# Patient Record
Sex: Male | Born: 1975 | Race: Black or African American | Hispanic: No | Marital: Married | Smoking: Never smoker
Health system: Southern US, Community
[De-identification: ages and names within clinical notes are randomized; demographics above are authoritative.]

## PROBLEM LIST (undated history)

## (undated) DIAGNOSIS — I10 Essential (primary) hypertension: Secondary | ICD-10-CM

## (undated) DIAGNOSIS — E785 Hyperlipidemia, unspecified: Secondary | ICD-10-CM

## (undated) DIAGNOSIS — E119 Type 2 diabetes mellitus without complications: Secondary | ICD-10-CM

## (undated) HISTORY — PX: OTHER SURGICAL HISTORY: SHX169

---

## 2019-06-15 ENCOUNTER — Emergency Department: Payer: Self-pay

## 2019-06-15 ENCOUNTER — Emergency Department
Admission: EM | Admit: 2019-06-15 | Discharge: 2019-06-15 | Disposition: A | Discharge status: Home / Self Care | Payer: Self-pay | Attending: Emergency Medicine | Admitting: Emergency Medicine

## 2019-06-15 ENCOUNTER — Other Ambulatory Visit: Payer: Self-pay

## 2019-06-15 DIAGNOSIS — R1011 Right upper quadrant pain: Secondary | ICD-10-CM | POA: Insufficient documentation

## 2019-06-15 DIAGNOSIS — N281 Cyst of kidney, acquired: Secondary | ICD-10-CM | POA: Insufficient documentation

## 2019-06-15 LAB — CBC
HCT: 32.5 % — ABNORMAL LOW (ref 39.0–52.0)
Hemoglobin: 10.8 g/dL — ABNORMAL LOW (ref 13.0–17.0)
MCH: 26.5 pg (ref 26.0–34.0)
MCHC: 33.2 g/dL (ref 30.0–36.0)
MCV: 79.9 fL — ABNORMAL LOW (ref 80.0–100.0)
Platelets: 190 10*3/uL (ref 150–400)
RBC: 4.07 MIL/uL — ABNORMAL LOW (ref 4.22–5.81)
RDW: 13.6 % (ref 11.5–15.5)
WBC: 5.5 10*3/uL (ref 4.0–10.5)
nRBC: 0 % (ref 0.0–0.2)

## 2019-06-15 LAB — COMPREHENSIVE METABOLIC PANEL
ALT: 10 U/L (ref 0–44)
AST: 12 U/L — ABNORMAL LOW (ref 15–41)
Albumin: 3.2 g/dL — ABNORMAL LOW (ref 3.5–5.0)
Alkaline Phosphatase: 39 U/L (ref 38–126)
Anion gap: 4 — ABNORMAL LOW (ref 5–15)
BUN: 22 mg/dL — ABNORMAL HIGH (ref 6–20)
CO2: 23 mmol/L (ref 22–32)
Calcium: 8.7 mg/dL — ABNORMAL LOW (ref 8.9–10.3)
Chloride: 112 mmol/L — ABNORMAL HIGH (ref 98–111)
Creatinine, Ser: 1.33 mg/dL — ABNORMAL HIGH (ref 0.61–1.24)
GFR calc Af Amer: >60 mL/min (ref >60)
GFR calc non Af Amer: >60 mL/min (ref >60)
Glucose, Bld: 195 mg/dL — ABNORMAL HIGH (ref 70–99)
Potassium: 4.9 mmol/L (ref 3.5–5.1)
Sodium: 139 mmol/L (ref 135–145)
Total Bilirubin: 0.9 mg/dL (ref 0.3–1.2)
Total Protein: 6.7 g/dL (ref 6.5–8.1)

## 2019-06-15 LAB — LIPASE, BLOOD: Lipase: 19 U/L (ref 11–51)

## 2019-06-15 LAB — URINALYSIS, COMPLETE (UACMP) WITH MICROSCOPIC
Bacteria, UA: NONE SEEN
Bilirubin Urine: NEGATIVE
Glucose, UA: NEGATIVE mg/dL
Hgb urine dipstick: NEGATIVE
Ketones, ur: NEGATIVE mg/dL
Leukocytes,Ua: NEGATIVE
Nitrite: NEGATIVE
Protein, ur: 100 mg/dL — AB
Specific Gravity, Urine: 1.01 (ref 1.005–1.030)
Squamous Epithelial / HPF: NONE SEEN (ref 0–5)
pH: 5 (ref 5.0–8.0)

## 2019-06-15 MED ORDER — CYCLOBENZAPRINE HCL 5 MG PO TABS
7.5000 mg | ORAL_TABLET | ORAL | Status: DC
  Filled 2019-06-15: qty 1.5

## 2019-06-15 MED ORDER — IOHEXOL 300 MG/ML  SOLN
125.0000 mL | Freq: Once | INTRAMUSCULAR | Status: AC | PRN
  Administered 2019-06-15: 150 mL via INTRAVENOUS
  Filled 2019-06-15: qty 125

## 2019-06-15 MED ORDER — CYCLOBENZAPRINE HCL 7.5 MG PO TABS: 7.5000 mg | ORAL_TABLET | Freq: Every evening | ORAL | 0 refills | Status: AC | PRN

## 2019-06-15 MED ORDER — IOHEXOL 240 MG/ML SOLN
50.0000 mL | Freq: Once | INTRAMUSCULAR | Status: AC
  Administered 2019-06-15: 19:00:00 50 mL via ORAL
  Filled 2019-06-15: qty 50

## 2019-06-15 MED ORDER — SODIUM CHLORIDE 0.9% FLUSH: 3.0000 mL | Freq: Once | INTRAVENOUS | Status: DC

## 2019-06-15 NOTE — ED Notes (Signed)
Pt does not want to stay to receive pain medicine

## 2019-06-15 NOTE — ED Notes (Signed)
This Rn attempted IV access x2 

## 2019-06-15 NOTE — ED Notes (Signed)
Pharmacy contacted regarding flexeril, tech states that they are working on it.

## 2019-06-15 NOTE — ED Triage Notes (Signed)
Pt comes via POV from home with c/o right lower abdominal pain that radiates to back. Pt states this has been going on for about a week.  Pt states he has had some issues urinating frequency. Pt denies any pain with urination.

## 2019-06-15 NOTE — ED Provider Notes (Signed)
[facility] Emergency Department Provider Note   ____________________________________________   First MD Initiated Contact with Patient 06/15/19 1843     (approximate)  I have reviewed the triage vital signs and the nursing notes.   HISTORY  Chief Complaint Abdominal Pain    HPI Albert Rice is a 43 y.o. male here for evaluation of right upper abdominal pain  Pain in his right upper abdomen that wraps around slightly towards his back for about 2 weeks.  Seems to be steadily getting worse however.  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  He saw his primary care doctor a few days ago, and he also reports he mentioned the symptoms to them at that time, they told him he had some protein in his urine.  No fevers or chills.  No known exposure to coronavirus.  Reports he has upcoming evaluation for protein in his urine and his primary doctors evaluate as well as some anemia as well.  No changes in bowel habits.  No black or bloody stool.  No vomiting.  Ports just the pain is persistent feels like a cramp or roll or bubble feeling primarily in the right upper abdomen.  Denies chest pain or shortness of breath   History reviewed. No pertinent past medical history.  There are no active problems to display for this patient.   History reviewed. No pertinent surgical history.  Prior to Admission medications   Medication Sig Start Date End Date Taking? Authorizing Provider  cyclobenzaprine (FEXMID) 7.5 MG tablet Take 1 tablet (7.5 mg total) by mouth at bedtime as needed for muscle spasms. 06/15/19   Sharyn CreamerQuale, Haytham Maher, MD    Allergies Patient has no allergy information on record.  No family history on file.  Social History Social History   Tobacco Use   Smoking status: Not on file  Substance Use Topics   Alcohol use: Not on file   Drug use: Not on file  Denies alcohol abuse, non-smoker  Review of  Systems Constitutional: No fever/chills Eyes: No visual changes. ENT: No sore throat. Cardiovascular: Denies chest pain. Respiratory: Denies shortness of breath. Gastrointestinal: See HPI Genitourinary: Negative for dysuria but reports little bit of urinary frequency and that his doctors been telling him he has protein in his urine which they are further evaluating. Musculoskeletal: Negative for back pain. Skin: Negative for rash. Neurological: Negative for headaches, areas of focal weakness or numbness.    ____________________________________________   PHYSICAL EXAM:  VITAL SIGNS: ED Triage Vitals  Enc Vitals Group     BP 06/15/19 1514 (!) 153/83     Pulse Rate 06/15/19 1514 82     Resp 06/15/19 1514 18     Temp 06/15/19 1514 98.3 F (36.8 C)     Temp Source 06/15/19 1514 Oral     SpO2 06/15/19 1514 99 %     Weight 06/15/19 1516 (!) 303 lb (137.4 kg)     Height 06/15/19 1516 5\' 11"  (1.803 m)     Head Circumference --      Peak Flow --      Pain Score 06/15/19 1516 6     Pain Loc --      Pain Edu? --      Excl. in GC? --     Constitutional: Alert and oriented. Well appearing and in no acute distress. Eyes: Conjunctivae are normal. Head: Atraumatic. Nose: No congestion/rhinnorhea. Mouth/Throat: Mucous membranes are moist.  Neck: No stridor.  Cardiovascular: Normal rate, regular rhythm. Grossly normal heart sounds.  Good peripheral circulation. Respiratory: Normal respiratory effort.  No retractions. Lungs CTAB. Gastrointestinal: Soft and nontender except in the right flank and mild discomfort in the right upper quadrant without an obvious Murphy sign.  Reports mild right-sided CVA tenderness. No distention. Musculoskeletal: No lower extremity tenderness nor edema. Neurologic:  Normal speech and language. No gross focal neurologic deficits are appreciated.  Skin:  Skin is warm, dry and intact. No rash noted. Psychiatric: Mood and affect are normal. Speech and behavior  are normal.  ____________________________________________   LABS (all labs ordered are listed, but only abnormal results are displayed)  Labs Reviewed  COMPREHENSIVE METABOLIC PANEL - Abnormal; Notable for the following components:      Result Value   Chloride 112 (*)    Glucose, Bld 195 (*)    BUN 22 (*)    Creatinine, Ser 1.33 (*)    Calcium 8.7 (*)    Albumin 3.2 (*)    AST 12 (*)    Anion gap 4 (*)    All other components within normal limits  CBC - Abnormal; Notable for the following components:   RBC 4.07 (*)    Hemoglobin 10.8 (*)    HCT 32.5 (*)    MCV 79.9 (*)    All other components within normal limits  URINALYSIS, COMPLETE (UACMP) WITH MICROSCOPIC - Abnormal; Notable for the following components:   Color, Urine YELLOW (*)    APPearance CLOUDY (*)    Protein, ur 100 (*)    All other components within normal limits  LIPASE, BLOOD   ____________________________________________  EKG   ____________________________________________  RADIOLOGY  Ct Abdomen Pelvis W Contrast  Result Date: 06/15/2019 CLINICAL DATA:  43 year old male with history of abdominal pain for the past 2 weeks (right-sided). EXAM: CT ABDOMEN AND PELVIS WITH CONTRAST TECHNIQUE: Multidetector CT imaging of the abdomen and pelvis was performed using the standard protocol following bolus administration of intravenous contrast. CONTRAST:  186mL OMNIPAQUE IOHEXOL 300 MG/ML  SOLN COMPARISON:  Unremarkable. FINDINGS: Lower chest: Unremarkable. Hepatobiliary: No suspicious cystic or solid hepatic lesions. No intra or extrahepatic biliary ductal dilatation. Gallbladder is normal in appearance. Pancreas: No pancreatic mass. No pancreatic ductal dilatation. No pancreatic or peripancreatic fluid collections or inflammatory changes. Spleen: Unremarkable. Adrenals/Urinary Tract: Multiple low-attenuation lesions in both kidneys, largest of which are compatible with simple cysts, measuring up to 2.8 cm in the upper  pole the right kidney. Multiple other subcentimeter low-attenuation lesions in both kidneys, too small to definitively characterize, but also favored to represent tiny cysts. No hydroureteronephrosis. Urinary bladder is normal in appearance. Bilateral adrenal glands are normal in appearance. Stomach/Bowel: Normal appearance of the stomach. No pathologic dilatation of small bowel or colon. Normal appendix. Vascular/Lymphatic: Aortic mild atherosclerosis in the pelvic vasculature, without evidence of aneurysm or dissection in the abdominal or pelvic vasculature. No lymphadenopathy noted in the abdomen or pelvis. Reproductive: Prostate gland and seminal vesicles are unremarkable in appearance. Other: No significant volume of ascites.  No pneumoperitoneum. Musculoskeletal: There are no aggressive appearing lytic or blastic lesions noted in the visualized portions of the skeleton. IMPRESSION: 1. No acute findings are noted in the abdomen or pelvis to account for the patient's symptoms. 2. Normal appendix. 3. Multiple low-attenuation renal lesions bilaterally, largest of which are compatible with simple cysts. The smaller lesions are too small to definitively characterize, but are also favored to represent tiny cysts. These could be  definitively characterized with nonemergent MRI of the abdomen with and without IV gadolinium if there is any history of hematuria or other clinical concern. Electronically Signed   By: Trudie Reed M.D.   On: 06/15/2019 20:35    CT reviewed, discussed with patient the results including renal cysts. ____________________________________________   PROCEDURES  Procedure(s) performed: None  Procedures  Critical Care performed: No  ____________________________________________   INITIAL IMPRESSION / ASSESSMENT AND PLAN / ED COURSE  Pertinent labs & imaging results that were available during my care of the patient were reviewed by me and considered in my medical decision  making (see chart for details).   Differential diagnosis includes but is not limited to, abdominal perforation, aortic dissection, cholecystitis, appendicitis, diverticulitis, colitis, esophagitis/gastritis, kidney stone, pyelonephritis, urinary tract infection, aortic aneurysm. All are considered in decision and treatment plan. Based upon the patient's presentation and risk factors, discussed with the patient, will proceed with CT scan to further evaluate for cause, evaluate for etiology such as cholelithiasis, musculoskeletal, cholecystitis, hepatobiliary etiology, colonic etiologies, etc.  Overall patient is well-appearing nontoxic does not wish for any pain medication at the present time.  On no acute cardiac, pulmonary or vascular symptoms.  No clear infectious symptoms.  Albert Rice was evaluated in Emergency Department on 06/15/2019 for the symptoms described in the history of present illness. He was evaluated in the context of the global COVID-19 pandemic, which necessitated consideration that the patient might be at risk for infection with the SARS-CoV-2 virus that causes COVID-19. Institutional protocols and algorithms that pertain to the evaluation of patients at risk for COVID-19 are in a state of rapid change based on information released by regulatory bodies including the CDC and federal and state organizations. These policies and algorithms were followed during the patient's care in the ED.   Clinical Course as of Jun 14 2122  Mon Jun 15, 2019  2005 Renal insufficiency noted, mild, also proteinuria, and review of records demonstrate that his physician and primary care is currently actively working up for this.  Also being worked up for mild anemia as well, hemoglobin stable from his most recent check in outside system   [MQ]    Clinical Course User Index [MQ] Sharyn Creamer, MD   ----------------------------------------- 9:23 PM on  06/15/2019 -----------------------------------------  Patient resting comfortably.  Ready for discharge, reviewed CT scan findings with him.  Will prescribe short course of Flexeril, suspect now with his reassuring CT this may be musculoskeletal or possible radicular type pain.  Patient comfortable with this plan, understands not to drive tonight or within 8 hours or still feeling drowsy after use of Flexeril.  We will follow-up closely with his doctor at [facility] and is already following for his anemia, proteinuria, and will update his physician on today's visit as well  Return precautions and treatment recommendations and follow-up discussed with the patient who is agreeable with the plan.   ____________________________________________   FINAL CLINICAL IMPRESSION(S) / ED DIAGNOSES  Final diagnoses:  Right upper quadrant abdominal pain  Bilateral renal cysts        Note:  This document was prepared using Dragon voice recognition software and may include unintentional dictation errors       Sharyn Creamer, MD 06/15/19 2124

## 2019-06-15 NOTE — Discharge Instructions (Signed)
No driving tonight, also do not drive within 8 hours of use of Flexeril.  Return the emergency room right away if you develop fever, worsening symptoms, chest pain, trouble breathing, or other new concerns arise  Follow-up closely with your doctor at [facility], please let them know about your CT scan today as they continue your evaluation for protein in your urine and anemia.

## 2019-12-21 ENCOUNTER — Other Ambulatory Visit: Payer: Self-pay

## 2019-12-21 ENCOUNTER — Emergency Department: Payer: BC Managed Care – PPO

## 2019-12-21 ENCOUNTER — Observation Stay
Admission: EM | Admit: 2019-12-21 | Discharge: 2019-12-22 | Disposition: A | Discharge status: Home / Self Care | Payer: BC Managed Care – PPO | Attending: Hospitalist | Admitting: Hospitalist

## 2019-12-21 DIAGNOSIS — N183 Chronic kidney disease, stage 3 unspecified: Secondary | ICD-10-CM | POA: Diagnosis not present

## 2019-12-21 DIAGNOSIS — I129 Hypertensive chronic kidney disease with stage 1 through stage 4 chronic kidney disease, or unspecified chronic kidney disease: Secondary | ICD-10-CM | POA: Insufficient documentation

## 2019-12-21 DIAGNOSIS — U071 COVID-19: Secondary | ICD-10-CM | POA: Diagnosis not present

## 2019-12-21 DIAGNOSIS — E785 Hyperlipidemia, unspecified: Secondary | ICD-10-CM | POA: Insufficient documentation

## 2019-12-21 DIAGNOSIS — E1165 Type 2 diabetes mellitus with hyperglycemia: Secondary | ICD-10-CM | POA: Diagnosis not present

## 2019-12-21 DIAGNOSIS — E118 Type 2 diabetes mellitus with unspecified complications: Secondary | ICD-10-CM

## 2019-12-21 DIAGNOSIS — E1122 Type 2 diabetes mellitus with diabetic chronic kidney disease: Secondary | ICD-10-CM | POA: Insufficient documentation

## 2019-12-21 DIAGNOSIS — Z6841 Body Mass Index (BMI) 40.0 and over, adult: Secondary | ICD-10-CM | POA: Diagnosis not present

## 2019-12-21 DIAGNOSIS — E875 Hyperkalemia: Principal | ICD-10-CM

## 2019-12-21 DIAGNOSIS — Z79899 Other long term (current) drug therapy: Secondary | ICD-10-CM | POA: Insufficient documentation

## 2019-12-21 DIAGNOSIS — E66813 Obesity, class 3: Secondary | ICD-10-CM | POA: Diagnosis present

## 2019-12-21 DIAGNOSIS — Z7984 Long term (current) use of oral hypoglycemic drugs: Secondary | ICD-10-CM | POA: Insufficient documentation

## 2019-12-21 DIAGNOSIS — E860 Dehydration: Secondary | ICD-10-CM

## 2019-12-21 HISTORY — DX: Essential (primary) hypertension: I10

## 2019-12-21 HISTORY — DX: Type 2 diabetes mellitus without complications: E11.9

## 2019-12-21 HISTORY — DX: Hyperlipidemia, unspecified: E78.5

## 2019-12-21 LAB — COMPREHENSIVE METABOLIC PANEL
ALT: 16 U/L (ref 0–44)
AST: 16 U/L (ref 15–41)
Albumin: 3.6 g/dL (ref 3.5–5.0)
Alkaline Phosphatase: 56 U/L (ref 38–126)
Anion gap: 6 (ref 5–15)
BUN: 29 mg/dL — ABNORMAL HIGH (ref 6–20)
CO2: 21 mmol/L — ABNORMAL LOW (ref 22–32)
Calcium: 8.2 mg/dL — ABNORMAL LOW (ref 8.9–10.3)
Chloride: 104 mmol/L (ref 98–111)
Creatinine, Ser: 1.62 mg/dL — ABNORMAL HIGH (ref 0.61–1.24)
GFR calc Af Amer: 59 mL/min — ABNORMAL LOW (ref >60)
GFR calc non Af Amer: 51 mL/min — ABNORMAL LOW (ref >60)
Glucose, Bld: 384 mg/dL — ABNORMAL HIGH (ref 70–99)
Potassium: 6.8 mmol/L (ref 3.5–5.1)
Sodium: 131 mmol/L — ABNORMAL LOW (ref 135–145)
Total Bilirubin: 1.2 mg/dL (ref 0.3–1.2)
Total Protein: 7.6 g/dL (ref 6.5–8.1)

## 2019-12-21 LAB — BASIC METABOLIC PANEL
Anion gap: 5 (ref 5–15)
Anion gap: 7 (ref 5–15)
BUN: 30 mg/dL — ABNORMAL HIGH (ref 6–20)
BUN: 30 mg/dL — ABNORMAL HIGH (ref 6–20)
CO2: 22 mmol/L (ref 22–32)
CO2: 22 mmol/L (ref 22–32)
Calcium: 8.2 mg/dL — ABNORMAL LOW (ref 8.9–10.3)
Calcium: 8.7 mg/dL — ABNORMAL LOW (ref 8.9–10.3)
Chloride: 110 mmol/L (ref 98–111)
Chloride: 107 mmol/L (ref 98–111)
Creatinine, Ser: 1.78 mg/dL — ABNORMAL HIGH (ref 0.61–1.24)
Creatinine, Ser: 1.96 mg/dL — ABNORMAL HIGH (ref 0.61–1.24)
GFR calc Af Amer: 53 mL/min — ABNORMAL LOW (ref >60)
GFR calc Af Amer: 47 mL/min — ABNORMAL LOW (ref >60)
GFR calc non Af Amer: 46 mL/min — ABNORMAL LOW (ref >60)
GFR calc non Af Amer: 41 mL/min — ABNORMAL LOW (ref >60)
Glucose, Bld: 164 mg/dL — ABNORMAL HIGH (ref 70–99)
Glucose, Bld: 175 mg/dL — ABNORMAL HIGH (ref 70–99)
Potassium: 6.5 mmol/L (ref 3.5–5.1)
Potassium: 5.6 mmol/L — ABNORMAL HIGH (ref 3.5–5.1)
Sodium: 137 mmol/L (ref 135–145)
Sodium: 136 mmol/L (ref 135–145)

## 2019-12-21 LAB — CBC
HCT: 39.4 % (ref 39.0–52.0)
Hemoglobin: 12.8 g/dL — ABNORMAL LOW (ref 13.0–17.0)
MCH: 25.8 pg — ABNORMAL LOW (ref 26.0–34.0)
MCHC: 32.5 g/dL (ref 30.0–36.0)
MCV: 79.4 fL — ABNORMAL LOW (ref 80.0–100.0)
Platelets: 151 10*3/uL (ref 150–400)
RBC: 4.96 MIL/uL (ref 4.22–5.81)
RDW: 13.3 % (ref 11.5–15.5)
WBC: 3.4 10*3/uL — ABNORMAL LOW (ref 4.0–10.5)
nRBC: 0 % (ref 0.0–0.2)

## 2019-12-21 LAB — GLUCOSE, CAPILLARY
Glucose-Capillary: 400 mg/dL — ABNORMAL HIGH (ref 70–99)
Glucose-Capillary: 343 mg/dL — ABNORMAL HIGH (ref 70–99)
Glucose-Capillary: 201 mg/dL — ABNORMAL HIGH (ref 70–99)
Glucose-Capillary: 167 mg/dL — ABNORMAL HIGH (ref 70–99)
Glucose-Capillary: 155 mg/dL — ABNORMAL HIGH (ref 70–99)
Glucose-Capillary: 134 mg/dL — ABNORMAL HIGH (ref 70–99)

## 2019-12-21 LAB — HEMOGLOBIN A1C
Hgb A1c MFr Bld: 8.4 % — ABNORMAL HIGH (ref 4.8–5.6)
Mean Plasma Glucose: 194.38 mg/dL

## 2019-12-21 LAB — POCT CBG MONITORING: CBG: 155

## 2019-12-21 LAB — CK: Total CK: 86 U/L (ref 49–397)

## 2019-12-21 MED ORDER — ONDANSETRON HCL 4 MG/2ML IJ SOLN: 4.0000 mg | Freq: Four times a day (QID) | INTRAMUSCULAR | Status: DC | PRN

## 2019-12-21 MED ORDER — SODIUM BICARBONATE 8.4 % IV SOLN
50.0000 meq | Freq: Once | INTRAVENOUS | Status: AC
  Administered 2019-12-21: 50 meq via INTRAVENOUS
  Filled 2019-12-21: qty 50

## 2019-12-21 MED ORDER — SODIUM CHLORIDE 0.9% FLUSH
3.0000 mL | Freq: Two times a day (BID) | INTRAVENOUS | Status: DC
  Administered 2019-12-21: 3 mL via INTRAVENOUS

## 2019-12-21 MED ORDER — DEXTROSE 50 % IV SOLN
1.0000 | Freq: Once | INTRAVENOUS | Status: AC
  Administered 2019-12-21: 50 mL via INTRAVENOUS
  Filled 2019-12-21: qty 50

## 2019-12-21 MED ORDER — ACETAMINOPHEN 650 MG RE SUPP: 650.0000 mg | Freq: Four times a day (QID) | RECTAL | Status: DC | PRN

## 2019-12-21 MED ORDER — SODIUM CHLORIDE 0.9 % IV BOLUS
500.0000 mL | Freq: Once | INTRAVENOUS | Status: AC
  Administered 2019-12-21: 500 mL via INTRAVENOUS

## 2019-12-21 MED ORDER — INSULIN REGULAR HUMAN 100 UNIT/ML IJ SOLN
10.0000 [IU] | Freq: Once | INTRAMUSCULAR | Status: AC
  Administered 2019-12-21: 14:00:00 10 [IU] via INTRAVENOUS
  Filled 2019-12-21: qty 3
  Filled 2019-12-21: qty 10

## 2019-12-21 MED ORDER — ACETAMINOPHEN 325 MG PO TABS
650.0000 mg | ORAL_TABLET | Freq: Four times a day (QID) | ORAL | Status: DC | PRN
  Administered 2019-12-22: 650 mg via ORAL
  Filled 2019-12-21: qty 2

## 2019-12-21 MED ORDER — ONDANSETRON HCL 4 MG PO TABS: 4.0000 mg | ORAL_TABLET | Freq: Four times a day (QID) | ORAL | Status: DC | PRN

## 2019-12-21 MED ORDER — PATIROMER SORBITEX CALCIUM 8.4 G PO PACK
16.8000 g | PACK | Freq: Every day | ORAL | Status: DC
  Administered 2019-12-21: 12:00:00 16.8 g via ORAL
  Filled 2019-12-21 (×2): qty 2

## 2019-12-21 MED ORDER — PATIROMER SORBITEX CALCIUM 8.4 G PO PACK
16.8000 g | PACK | Freq: Every day | ORAL | Status: DC
  Administered 2019-12-22: 16.8 g via ORAL
  Filled 2019-12-21 (×2): qty 2

## 2019-12-21 MED ORDER — SODIUM CHLORIDE 0.9 % IV SOLN: INTRAVENOUS | Status: DC

## 2019-12-21 MED ORDER — INSULIN ASPART 100 UNIT/ML IV SOLN
10.0000 [IU] | Freq: Once | INTRAVENOUS | Status: AC
  Administered 2019-12-21: 18:00:00 10 [IU] via INTRAVENOUS
  Filled 2019-12-21: qty 0.1

## 2019-12-21 MED ORDER — ENOXAPARIN SODIUM 40 MG/0.4ML ~~LOC~~ SOLN
40.0000 mg | SUBCUTANEOUS | Status: DC
  Administered 2019-12-21: 40 mg via SUBCUTANEOUS
  Filled 2019-12-21: qty 0.4

## 2019-12-21 MED ORDER — KETOROLAC TROMETHAMINE 30 MG/ML IJ SOLN
30.0000 mg | Freq: Once | INTRAMUSCULAR | Status: AC
  Administered 2019-12-21: 09:00:00 30 mg via INTRAVENOUS
  Filled 2019-12-21: qty 1

## 2019-12-21 MED ORDER — INSULIN ASPART 100 UNIT/ML ~~LOC~~ SOLN
0.0000 [IU] | Freq: Three times a day (TID) | SUBCUTANEOUS | Status: DC
  Administered 2019-12-21: 4 [IU] via SUBCUTANEOUS
  Administered 2019-12-21 – 2019-12-22 (×2): 11 [IU] via SUBCUTANEOUS
  Administered 2019-12-22 (×2): 4 [IU] via SUBCUTANEOUS
  Filled 2019-12-21 (×5): qty 1

## 2019-12-21 MED ORDER — CALCIUM GLUCONATE-NACL 1-0.675 GM/50ML-% IV SOLN
1.0000 g | Freq: Once | INTRAVENOUS | Status: AC
  Administered 2019-12-21: 1000 mg via INTRAVENOUS
  Filled 2019-12-21: qty 50

## 2019-12-21 MED ORDER — DEXTROSE 50 % IV SOLN
1.0000 | Freq: Once | INTRAVENOUS | Status: AC
  Administered 2019-12-21: 14:00:00 50 mL via INTRAVENOUS
  Filled 2019-12-21: qty 50

## 2019-12-21 MED ORDER — INSULIN ASPART 100 UNIT/ML ~~LOC~~ SOLN
8.0000 [IU] | Freq: Once | SUBCUTANEOUS | Status: AC
  Administered 2019-12-21: 09:00:00 8 [IU] via INTRAVENOUS
  Filled 2019-12-21: qty 1

## 2019-12-21 MED ORDER — CYCLOBENZAPRINE HCL 5 MG PO TABS
7.5000 mg | ORAL_TABLET | Freq: Every evening | ORAL | Status: DC | PRN
  Filled 2019-12-21: qty 1.5

## 2019-12-21 NOTE — Progress Notes (Signed)
Pt high K treated with insulin and dextrose for primary, RN Dani. Will CTM patient until RN back from break.

## 2019-12-21 NOTE — ED Notes (Addendum)
Second BMP sent to lab.   Pt resting comfortably in chair, pt off VS monitor. NAD noted, denies further needs.

## 2019-12-21 NOTE — ED Notes (Signed)
Pt called out with c/o back spasms at 8/10 pain. Pt grimacing and attempting to change position for comfort.

## 2019-12-21 NOTE — ED Notes (Signed)
Attempted to call floor for pt's room assignment. Per Software engineer was unaware and looking at it now. This RN ascom number given and waiting return call.

## 2019-12-21 NOTE — ED Notes (Signed)
Date and time results received: 12/21/19 1018  (use smartphrase ".now" to insert current time)  Test: K Critical Value: 6.5  Name of Provider Notified: Kinner  Orders Received? Or Actions Taken?: Orders Received - See Orders for details

## 2019-12-21 NOTE — ED Notes (Signed)
Called lab and informed them of orders now for blood sent down earlier. Lab to run blood  At this time

## 2019-12-21 NOTE — H&P (Signed)
History and Physical    Albert Rice WUJ:[id number] DOB: [birth year] DOA: 12/21/2019  PCP: Patient, No Pcp Per   Patient coming from: Home  I have personally briefly reviewed patient's old medical records in [facility]  Chief Complaint: Muscle cramps  HPI: Albert Rice is a 44 y.o. male with medical history significant for morbid obesity (BMI 40.45 kg/m2), Diabetes mellitus and hypertension who presented to the emergency room for evaluation of leg cramps.  Patient was diagnosed with COVID-19 viral infection on 12/17/19 and has had symptoms since Tuesday, 12/15/19.  He has had myalgias, diarrhea, loss of taste and smell as well as fever but denies having any vomiting or shortness of breath.  Other family members have viral symptoms as well.  He denies having any chest pain, abdominal pain, urinary symptoms, diaphoresis, shortness of breath, dizziness or lightheadedness  ED Course: Patient with a recent diagnosis of COVID-19 viral infection seen in the eemrgency room for evaluation of leg cramps and found to have significant hyperkalemia of 6.8. Repeat potassium levels were also elevated.  Patient received a dose of Veltassa in the ER and nephrology consult was requested  Review of Systems: As per HPI otherwise 10 point review of systems negative.    Past Medical History:  Diagnosis Date  . Diabetes mellitus without complication (HCC)   . Hyperlipemia   . Hypertension     History reviewed. No pertinent surgical history.   reports that he has never smoked. He has never used smokeless tobacco. He reports that he does not drink alcohol or use drugs.  Not on File  No family history on file.   Prior to Admission medications   Medication Sig Start Date End Date Taking? Authorizing Provider  cyclobenzaprine (FEXMID) 7.5 MG tablet Take 1 tablet (7.5 mg total) by mouth at bedtime as needed for muscle spasms. 06/15/19   Sharyn Creamer, MD    Physical Exam: Vitals:   12/21/19 0819  12/21/19 0955 12/21/19 0956 12/21/19 1048  BP:   105/77 109/82  Pulse: 93 (!) 101  88  Resp: 16   18  Temp:      TempSrc:      SpO2: 100% 97%  100%  Weight:      Height:         Vitals:   12/21/19 0819 12/21/19 0955 12/21/19 0956 12/21/19 1048  BP:   105/77 109/82  Pulse: 93 (!) 101  88  Resp: 16   18  Temp:      TempSrc:      SpO2: 100% 97%  100%  Weight:      Height:        Constitutional: NAD, alert and oriented to person place and time.  Appears comfortable in no distress Eyes: PERRL, lids and conjunctivae normal ENMT: Mucous membranes are moist.  Neck: normal, supple, no masses, no thyromegaly Respiratory: clear to auscultation bilaterally, no wheezing, no crackles. Normal respiratory effort. No accessory muscle use.  Cardiovascular: Tachycardic, no murmurs / rubs / gallops. No extremity edema. 2+ pedal pulses. No carotid bruits.  Abdomen: no tenderness, no masses palpated. No hepatosplenomegaly. Bowel sounds positive.  Musculoskeletal: no clubbing / cyanosis. No joint deformity upper and lower extremities.  Skin: no rashes, lesions, ulcers.  Neurologic: No gross focal neurologic deficit. Psychiatric: Normal mood and affect.   Labs on Admission: I have personally reviewed following labs and imaging studies  CBC: Recent Labs  Lab 12/21/19 0719  WBC 3.4*  HGB 12.8*  HCT 39.4  MCV 79.4*  PLT 161   Basic Metabolic Panel: Recent Labs  Lab 12/21/19 0719  NA 137  131*  K 6.5*  6.8*  CL 110  104  CO2 22  21*  GLUCOSE 164*  384*  BUN 30*  29*  CREATININE 1.78*  1.62*  CALCIUM 8.2*  8.2*   GFR: Estimated Creatinine Clearance: 81.3 mL/min (A) (by C-G formula based on SCr of 1.62 mg/dL (H)). Liver Function Tests: Recent Labs  Lab 12/21/19 0719  AST 16  ALT 16  ALKPHOS 56  BILITOT 1.2  PROT 7.6  ALBUMIN 3.6   No results for input(s): LIPASE, AMYLASE in the last 168 hours. No results for input(s): AMMONIA in the last 168 hours. Coagulation  Profile: No results for input(s): INR, PROTIME in the last 168 hours. Cardiac Enzymes: No results for input(s): CKTOTAL, CKMB, CKMBINDEX, TROPONINI in the last 168 hours. BNP (last 3 results) No results for input(s): PROBNP in the last 8760 hours. HbA1C: No results for input(s): HGBA1C in the last 72 hours. CBG: Recent Labs  Lab 12/21/19 0721 12/21/19 0839  GLUCAP 400* 343*   Lipid Profile: No results for input(s): CHOL, HDL, LDLCALC, TRIG, CHOLHDL, LDLDIRECT in the last 72 hours. Thyroid Function Tests: No results for input(s): TSH, T4TOTAL, FREET4, T3FREE, THYROIDAB in the last 72 hours. Anemia Panel: No results for input(s): VITAMINB12, FOLATE, FERRITIN, TIBC, IRON, RETICCTPCT in the last 72 hours. Urine analysis:    Component Value Date/Time   COLORURINE YELLOW (A) 06/15/2019 1518   APPEARANCEUR CLOUDY (A) 06/15/2019 1518   LABSPEC 1.010 06/15/2019 1518   PHURINE 5.0 06/15/2019 1518   GLUCOSEU NEGATIVE 06/15/2019 1518   HGBUR NEGATIVE 06/15/2019 1518   [place] NEGATIVE 06/15/2019 [place] 06/15/2019 1518   PROTEINUR 100 (A) 06/15/2019 1518   NITRITE NEGATIVE 06/15/2019 1518   LEUKOCYTESUR NEGATIVE 06/15/2019 1518    Radiological Exams on Admission: DG Chest Port 1 View  Result Date: 12/21/2019 CLINICAL DATA:  Weakness EXAM: PORTABLE CHEST 1 VIEW COMPARISON:  None. FINDINGS: Heart and mediastinal contours are within normal limits. No focal opacities or effusions. No acute bony abnormality. IMPRESSION: No active disease. Electronically Signed   By: Rolm Baptise M.D.   On: 12/21/2019 07:47    EKG: Independently reviewed.  Sinus rhythm  Assessment/Plan Principal Problem:   Hyperkalemia Active Problems:   Obesity, Class III, BMI 40-49.9 (morbid obesity) (HCC)   Type 2 diabetes mellitus with complication, without long-term current use of insulin (HCC)     Hyperkalemia Etiology unclear but concern for possible rhabdomyolysis since patient  presented for evaluation of muscle cramps 12 Lead EKG shows no acute findings Will treat with dextrose, insulin, calcium gluconate and sodium bicarbonate Obtain creatinine kinase level Repeat potassium levels   Morbid obesity (BMI 40) Complicates overall prognosis and care   Diabetes mellitus with complications of hyperglycemia and chronic kidney disease stage III Start patient on IV fluid hydration Sliding scale coverage At baseline patient has a serum creatinine of 1.3 and on admission it was 1.7 Expect improvement in renal function with IV fluid hydration    DVT prophylaxis: Lovenox Code Status: Full Family Communication: Plan of care was discussed with patient. He verbalizes understanding and agrees with the plan Disposition Plan: Back to previous home environment Consults called: Nephrology    Collier Bullock MD [facility]     12/21/2019, 10:55 AM

## 2019-12-21 NOTE — ED Notes (Signed)
RN Charge Amy called and informed that pt has copy of COVID test result and we will get it scanned into his chart.

## 2019-12-21 NOTE — ED Notes (Signed)
Date and time results received: 12/21/19 0830 (use smartphrase ".now" to insert current time)  Test: K  Critical Value: 6.8  Name of Provider Notified: Kinner  Orders Received? Or Actions Taken?: Orders Received - See Orders for details

## 2019-12-21 NOTE — ED Notes (Signed)
Called Pharmacy to send up medication for pt 

## 2019-12-21 NOTE — ED Notes (Signed)
Received call from 1C stating that the pt did not have a positive covid result showing in his chart. RN Amy Charge states that pt needs this to be admitted to their floor. RN Occupational hygienist informed and looking into it.

## 2019-12-21 NOTE — ED Notes (Signed)
Pt being transported to 1C-112 by EDT Tracey at this time.

## 2019-12-21 NOTE — ED Triage Notes (Signed)
Pt comes via [facility] from home with c/o leg cramps, diarrhea and pt is COVID+ as of Thursday.  Pt denies any CP, SOB or abdominal pain. Pt is diabetic and current CBG-420 and pt states he takes Metformin which he has not taken today.  EMS reports BP-160/84, temp-98.7, HR-90, 98% O2 RA.

## 2019-12-21 NOTE — ED Provider Notes (Signed)
[facility] Emergency Department Provider Note   ____________________________________________    I have reviewed the triage vital signs and the nursing notes.   HISTORY  Chief Complaint leg cramps and COVID+     HPI Albert Rice is a 44 y.o. male who was diagnosed with novel coronavirus on Thursday of last week, has had symptoms since Tuesday of last week.  He denies shortness of breath, no significant cough.  Complains of body aches fatigue and loss of taste and smell.  Has been taking Tylenol for body aches with little improvement.  Has been having diarrhea, no vomiting.  Does have a history of diabetes for which he takes Metformin.  Reports glucose has been poorly controlled.  Family has coronavirus as well  Past Medical History:  Diagnosis Date  . Diabetes mellitus without complication ([place])   . Hyperlipemia   . Hypertension     Patient Active Problem List   Diagnosis Date Noted  . Hyperkalemia 12/21/2019  . Obesity, Class III, BMI 40-49.9 (morbid obesity) ([place]) 12/21/2019  . Type 2 diabetes mellitus with complication, without long-term current use of insulin ([place]) 12/21/2019    History reviewed. No pertinent surgical history.  Prior to Admission medications   Medication Sig Start Date End Date Taking? Authorizing Provider  cyclobenzaprine (FEXMID) 7.5 MG tablet Take 1 tablet (7.5 mg total) by mouth at bedtime as needed for muscle spasms. 06/15/19   Delman Kitten, MD     Allergies Patient has no allergy information on record.  No family history on file.  Social History Social History   Tobacco Use  . Smoking status: Never Smoker  . Smokeless tobacco: Never Used  Substance Use Topics  . Alcohol use: Never  . Drug use: Never    Review of Systems  Constitutional: Positive chills Eyes: No visual changes.  ENT: Mild sore throat Cardiovascular: Denies chest pain. Respiratory: No cough or shortness of breath Gastrointestinal: No  abdominal pain, diarrhea Genitourinary: Negative for dysuria. Musculoskeletal: Positive myalgias Skin: Negative for rash. Neurological: Negative for focal weakness   ____________________________________________   PHYSICAL EXAM:  VITAL SIGNS: ED Triage Vitals  Enc Vitals Group     BP 12/21/19 0716 (!) 148/102     Pulse Rate 12/21/19 0716 93     Resp 12/21/19 0716 17     Temp 12/21/19 0716 99.3 F (37.4 C)     Temp Source 12/21/19 0716 Oral     SpO2 12/21/19 0716 97 %     Weight 12/21/19 0718 131.5 kg (290 lb)     Height 12/21/19 0718 1.803 m (5\' 11" )     Head Circumference --      Peak Flow --      Pain Score 12/21/19 0718 0     Pain Loc --      Pain Edu? --      Excl. in [place]? --     Constitutional: Alert and oriented.  Nose: No congestion/rhinnorhea. Mouth/Throat: Mucous membranes are moist.   Neck:  Painless ROM Cardiovascular: Normal rate, regular rhythm.   Good peripheral circulation. Respiratory: Normal respiratory effort.  No retractions.  Gastrointestinal:  No distention.   Musculoskeletal: .  Warm and well perfused Neurologic:  Normal speech and language. No gross focal neurologic deficits are appreciated.  Skin:  Skin is warm, dry and intact. No rash noted. Psychiatric: Mood and affect are normal. Speech and behavior are normal.  ____________________________________________   LABS (all labs ordered are listed, but only  abnormal results are displayed)  Labs Reviewed  GLUCOSE, CAPILLARY - Abnormal; Notable for the following components:      Result Value   Glucose-Capillary 400 (*)    All other components within normal limits  CBC - Abnormal; Notable for the following components:   WBC 3.4 (*)    Hemoglobin 12.8 (*)    MCV 79.4 (*)    MCH 25.8 (*)    All other components within normal limits  COMPREHENSIVE METABOLIC PANEL - Abnormal; Notable for the following components:   Sodium 131 (*)    Potassium 6.8 (*)    CO2 21 (*)    Glucose, Bld 384 (*)      BUN 29 (*)    Creatinine, Ser 1.62 (*)    Calcium 8.2 (*)    GFR calc non Af Amer 51 (*)    GFR calc Af Amer 59 (*)    All other components within normal limits  BLOOD GAS, VENOUS - Abnormal; Notable for the following components:   pCO2, Ven 42 (*)    Acid-base deficit 3.0 (*)    All other components within normal limits  GLUCOSE, CAPILLARY - Abnormal; Notable for the following components:   Glucose-Capillary 343 (*)    All other components within normal limits  BASIC METABOLIC PANEL - Abnormal; Notable for the following components:   Potassium 6.5 (*)    Glucose, Bld 164 (*)    BUN 30 (*)    Creatinine, Ser 1.78 (*)    Calcium 8.2 (*)    GFR calc non Af Amer 46 (*)    GFR calc Af Amer 53 (*)    All other components within normal limits  CK  BASIC METABOLIC PANEL  HEMOGLOBIN A1C  HIV ANTIBODY (ROUTINE TESTING W REFLEX)   ____________________________________________  EKG  ED ECG REPORT I, Jene Every, the attending physician, personally viewed and interpreted this ECG.  Date: 12/21/2019  Rhythm: normal sinus rhythm QRS Axis: normal Intervals: normal ST/T Wave abnormalities: normal Narrative Interpretation: no evidence of acute ischemia  ____________________________________________  RADIOLOGY  X-ray unremarkable ____________________________________________   PROCEDURES  Procedure(s) performed: No  Procedures   Critical Care performed: No ____________________________________________   INITIAL IMPRESSION / ASSESSMENT AND PLAN / ED COURSE  Pertinent labs & imaging results that were available during my care of the patient were reviewed by me and considered in my medical decision making (see chart for details).  Patient presents with known COVID-19 with complaints of diarrhea and body aches, certainly symptoms related to COVID-19.  Likely he is not complaining of shortness of breath and his vital signs are reassuring.  Chest x-ray is unremarkable.  Will  check labs as he reports elevated glucose and consider insulin versus IV fluids.  We will give IV Toradol for body aches.  No oxygen requirement.  Notified of potassium of 6.8 which I find unusual given mild dehydration BUN 29 creatinine 1.62, question hemolysis, EKG normal.  Will give IV fluids recheck potassium to see if this is an accurate level  On recheck potassium remains elevated, discussed with Dr. Lourdes Sledge of nephrology who recommends 16.8 of Veltassa.  CK checked and is normal.  Will admit to the hospital service    ____________________________________________   FINAL CLINICAL IMPRESSION(S) / ED DIAGNOSES  Final diagnoses:  Acute hyperkalemia  COVID-19  Dehydration        Note:  This document was prepared using Dragon voice recognition software and may include unintentional dictation errors.   Jene Every,  MD 12/21/19 1202

## 2019-12-22 DIAGNOSIS — E875 Hyperkalemia: Secondary | ICD-10-CM | POA: Diagnosis not present

## 2019-12-22 LAB — BASIC METABOLIC PANEL
Anion gap: 9 (ref 5–15)
Anion gap: 10 (ref 5–15)
BUN: 30 mg/dL — ABNORMAL HIGH (ref 6–20)
BUN: 26 mg/dL — ABNORMAL HIGH (ref 6–20)
CO2: 17 mmol/L — ABNORMAL LOW (ref 22–32)
CO2: 19 mmol/L — ABNORMAL LOW (ref 22–32)
Calcium: 8.1 mg/dL — ABNORMAL LOW (ref 8.9–10.3)
Calcium: 8.6 mg/dL — ABNORMAL LOW (ref 8.9–10.3)
Chloride: 108 mmol/L (ref 98–111)
Chloride: 107 mmol/L (ref 98–111)
Creatinine, Ser: 1.62 mg/dL — ABNORMAL HIGH (ref 0.61–1.24)
Creatinine, Ser: 1.5 mg/dL — ABNORMAL HIGH (ref 0.61–1.24)
GFR calc Af Amer: 59 mL/min — ABNORMAL LOW (ref >60)
GFR calc Af Amer: >60 mL/min (ref >60)
GFR calc non Af Amer: 51 mL/min — ABNORMAL LOW (ref >60)
GFR calc non Af Amer: 56 mL/min — ABNORMAL LOW (ref >60)
Glucose, Bld: 246 mg/dL — ABNORMAL HIGH (ref 70–99)
Glucose, Bld: 164 mg/dL — ABNORMAL HIGH (ref 70–99)
Potassium: 5.6 mmol/L — ABNORMAL HIGH (ref 3.5–5.1)
Potassium: 4.9 mmol/L (ref 3.5–5.1)
Sodium: 134 mmol/L — ABNORMAL LOW (ref 135–145)
Sodium: 136 mmol/L (ref 135–145)

## 2019-12-22 LAB — GLUCOSE, CAPILLARY
Glucose-Capillary: 264 mg/dL — ABNORMAL HIGH (ref 70–99)
Glucose-Capillary: 187 mg/dL — ABNORMAL HIGH (ref 70–99)
Glucose-Capillary: 182 mg/dL — ABNORMAL HIGH (ref 70–99)

## 2019-12-22 LAB — CBC
HCT: 34.1 % — ABNORMAL LOW (ref 39.0–52.0)
Hemoglobin: 10.9 g/dL — ABNORMAL LOW (ref 13.0–17.0)
MCH: 25.3 pg — ABNORMAL LOW (ref 26.0–34.0)
MCHC: 32 g/dL (ref 30.0–36.0)
MCV: 79.3 fL — ABNORMAL LOW (ref 80.0–100.0)
Platelets: 108 10*3/uL — ABNORMAL LOW (ref 150–400)
RBC: 4.3 MIL/uL (ref 4.22–5.81)
RDW: 13.2 % (ref 11.5–15.5)
WBC: 3.4 10*3/uL — ABNORMAL LOW (ref 4.0–10.5)
nRBC: 0 % (ref 0.0–0.2)

## 2019-12-22 LAB — HIV ANTIBODY (ROUTINE TESTING W REFLEX): HIV Screen 4th Generation wRfx: NONREACTIVE

## 2019-12-22 MED ORDER — AMLODIPINE BESYLATE 10 MG PO TABS
10.0000 mg | ORAL_TABLET | Freq: Every day | ORAL | Status: DC
  Administered 2019-12-22: 10 mg via ORAL
  Filled 2019-12-22: qty 1

## 2019-12-22 MED ORDER — HYDRALAZINE HCL 20 MG/ML IJ SOLN
10.0000 mg | Freq: Four times a day (QID) | INTRAMUSCULAR | Status: DC | PRN
  Administered 2019-12-22 (×2): 10 mg via INTRAVENOUS
  Filled 2019-12-22 (×2): qty 1

## 2019-12-22 MED ORDER — ENOXAPARIN SODIUM 40 MG/0.4ML ~~LOC~~ SOLN
40.0000 mg | Freq: Two times a day (BID) | SUBCUTANEOUS | Status: DC
  Administered 2019-12-22: 09:00:00 40 mg via SUBCUTANEOUS
  Filled 2019-12-22: qty 0.4

## 2019-12-22 MED ORDER — SODIUM POLYSTYRENE SULFONATE 15 GM/60ML PO SUSP
15.0000 g | Freq: Once | ORAL | Status: AC
  Administered 2019-12-22: 09:00:00 15 g via ORAL
  Filled 2019-12-22: qty 60

## 2019-12-22 MED ORDER — LISINOPRIL 20 MG PO TABS
20.0000 mg | ORAL_TABLET | Freq: Every day | ORAL | Status: DC
  Administered 2019-12-22: 20 mg via ORAL
  Filled 2019-12-22: qty 1

## 2019-12-22 NOTE — Progress Notes (Signed)
Anticoagulation monitoring(Lovenox):  43yo  M ordered Lovenox 40 mg Q24h  Filed Weights   12/21/19 0718  Weight: 290 lb (131.5 kg)   BMI 40.45   Lab Results  Component Value Date   CREATININE 1.62 (H) 12/22/2019   CREATININE 1.96 (H) 12/21/2019   CREATININE 1.62 (H) 12/21/2019   CREATININE 1.78 (H) 12/21/2019   Estimated Creatinine Clearance: 81.3 mL/min (A) (by C-G formula based on SCr of 1.62 mg/dL (H)). Hemoglobin & Hematocrit     Component Value Date/Time   HGB 10.9 (L) 12/22/2019 0448   HCT 34.1 (L) 12/22/2019 0448     Per Protocol for Patient with estCrcl > 30 ml/min and BMI > 40, will transition to Lovenox 40 mgQ12h.     Bari Mantis PharmD Clinical Pharmacist 12/22/2019

## 2019-12-22 NOTE — Progress Notes (Signed)
Paper copy of positive Covid test from 12/17/2019 placed on patient's chart.  Orson Ape, BSN

## 2019-12-22 NOTE — Progress Notes (Signed)
Patient has new order for Kayexlate at [phone] and received Veltassa this morning at 0737.  Per MAR, no PO meds should given 3 hrs before or after Veltassa. Called and spoke with Koren Bound, [facility] who verified that it's ok to go ahead and give PO medication within the 3 hrs.  Also verified with Dr. Fran Lowes that she knew patient had received Veltassa and she wants Kayexlate given in addition, Dr. Fran Lowes verified she does want him to receive both medications.  Also made her aware of patient high BP overnight and his morning, patient takes BP medications at home.  Those have been restarted. Dr. Fran Lowes was made aware of Calcium 8.1, stated probably due to dilution from IVF and is ok.  Patient instructed to let me know when he has had a bowel movement.  Orson Ape, BSN

## 2019-12-22 NOTE — Progress Notes (Signed)
Patient's BP elevated 173/95 after recheck on Dinamap E. Ouma  NP notified, orders recieved

## 2019-12-22 NOTE — Consult Note (Signed)
[facility] CONSULT NOTE    Date: 12/22/2019                  Patient Name:  Albert Rice  MRN: [id number]  DOB: 12/06/1975  Age / Sex: 44 y.o., male         PCP: Patient, No Pcp Per                 Service Requesting Consult:  Hospitalist                 Reason for Consult:  Acute kidney injury, chronic kidney disease stage II, hyperkalemia            History of Present Illness: Patient is a 44 y.o. male with a PMHx of hypertension, diabetes mellitus type 2 with chronic kidney disease, chronic kidney disease stage II, proteinuria, hyperlipidemia, who was admitted to [facility] on 12/21/2019 for evaluation of muscle cramps.  Patient recently diagnosed with COVID-19 infection on 12/17/2019.  He reports fever, myalgias, diarrhea, and loss of taste.  In addition he denies nausea or vomiting.  We are asked to see him for acute kidney injury in the setting of known chronic kidney disease stage II and proteinuria.  The patient's baseline creatinine appears to be 1.4.  Upon presentation creatinine was 1.96 with an EGFR 47.  Potassium initially was also quite high at greater than 6.  Now down to 5.6 with use of Veltassa as well as Kayexalate.  Patient not requiring oxygen at the time of evaluation.   Medications: Outpatient medications: Medications Prior to Admission  Medication Sig Dispense Refill Last Dose  . amLODipine (NORVASC) 10 MG tablet Take 10 mg by mouth daily.   12/21/2019 at Unknown time  . cyclobenzaprine (FEXMID) 7.5 MG tablet Take 1 tablet (7.5 mg total) by mouth at bedtime as needed for muscle spasms. 14 tablet 0   . lisinopril (ZESTRIL) 20 MG tablet Take 20 mg by mouth daily.   12/21/2019 at Unknown time  . metFORMIN (GLUCOPHAGE) 1000 MG tablet Take 1,000 mg by mouth 2 (two) times daily with a meal.   12/21/2019 at Unknown time  . pravastatin (PRAVACHOL) 20 MG tablet Take 20 mg by mouth daily.   12/21/2019 at Unknown time    Current medications: Current  Facility-Administered Medications  Medication Dose Route Frequency Provider Last Rate Last Admin  . 0.9 %  sodium chloride infusion   Intravenous Continuous Enzo Bi, MD   Stopped at 12/22/19 1348  . acetaminophen (TYLENOL) tablet 650 mg  650 mg Oral Q6H PRN Agbata, Tochukwu, MD   650 mg at 12/22/19 1516   Or  . acetaminophen (TYLENOL) suppository 650 mg  650 mg Rectal Q6H PRN Agbata, Tochukwu, MD      . amLODipine (NORVASC) tablet 10 mg  10 mg Oral Daily Enzo Bi, MD   10 mg at 12/22/19 0859  . cyclobenzaprine (FLEXERIL) tablet 7.5 mg  7.5 mg Oral QHS PRN Agbata, Tochukwu, MD      . enoxaparin (LOVENOX) injection 40 mg  40 mg Subcutaneous Q12H Enzo Bi, MD   40 mg at 12/22/19 5621  . hydrALAZINE (APRESOLINE) injection 10 mg  10 mg Intravenous Q6H PRN Lang Snow, NP   10 mg at 12/22/19 0501  . insulin aspart (novoLOG) injection 0-20 Units  0-20 Units Subcutaneous TID WC Agbata, Tochukwu, MD   4 Units at 12/22/19 1205  . lisinopril (ZESTRIL) tablet 20 mg  20 mg Oral Daily Billie Ruddy,  Otila Kluver, MD   20 mg at 12/22/19 0859  . ondansetron (ZOFRAN) tablet 4 mg  4 mg Oral Q6H PRN Agbata, Tochukwu, MD       Or  . ondansetron (ZOFRAN) injection 4 mg  4 mg Intravenous Q6H PRN Agbata, Tochukwu, MD      . patiromer (VELTASSA) packet 16.8 g  16.8 g Oral Daily Agbata, Tochukwu, MD   16.8 g at 12/22/19 0737  . sodium chloride flush (NS) 0.9 % injection 3 mL  3 mL Intravenous Q12H Agbata, Tochukwu, MD   3 mL at 12/21/19 1216      Allergies: Not on File    Past Medical History: Past Medical History:  Diagnosis Date  . Diabetes mellitus without complication ([place])   . Hyperlipemia   . Hypertension      Past Surgical History: History reviewed. No pertinent surgical history.   Family History: No family history of ESRD.  Social History: Social History   Socioeconomic History  . Marital status: Married    Spouse name: Not on file  . Number of children: Not on file  . Years of education:  Not on file  . Highest education level: Not on file  Occupational History  . Not on file  Tobacco Use  . Smoking status: Never Smoker  . Smokeless tobacco: Never Used  Substance and Sexual Activity  . Alcohol use: Never  . Drug use: Never  . Sexual activity: Not on file  Other Topics Concern  . Not on file  Social History Narrative  . Not on file   Social Determinants of Health   Financial Resource Strain:   . Difficulty of Paying Living Expenses: Not on file  Food Insecurity:   . Worried About Charity fundraiser in the Last Year: Not on file  . Ran Out of Food in the Last Year: Not on file  Transportation Needs:   . Lack of Transportation (Medical): Not on file  . Lack of Transportation (Non-Medical): Not on file  Physical Activity:   . Days of Exercise per Week: Not on file  . Minutes of Exercise per Session: Not on file  Stress:   . Feeling of Stress : Not on file  Social Connections:   . Frequency of Communication with Friends and Family: Not on file  . Frequency of Social Gatherings with Friends and Family: Not on file  . Attends Religious Services: Not on file  . Active Member of Clubs or Organizations: Not on file  . Attends Archivist Meetings: Not on file  . Marital Status: Not on file  Intimate Partner Violence:   . Fear of Current or Ex-Partner: Not on file  . Emotionally Abused: Not on file  . Physically Abused: Not on file  . Sexually Abused: Not on file     Review of Systems: Review of Systems  Constitutional: Positive for chills and fever. Negative for malaise/fatigue.  HENT: Negative for congestion, hearing loss and tinnitus.        Loss of smell/taste  Eyes: Negative for blurred vision and double vision.  Respiratory: Negative for cough, sputum production and shortness of breath.   Cardiovascular: Negative for chest pain, palpitations and orthopnea.  Gastrointestinal: Positive for diarrhea. Negative for nausea and vomiting.   Genitourinary: Negative for dysuria, frequency and urgency.  Musculoskeletal: Positive for myalgias.  Skin: Negative for itching and rash.  Neurological: Negative for dizziness and focal weakness.  Endo/Heme/Allergies: Negative for polydipsia. Does not bruise/bleed easily.  Psychiatric/Behavioral: The patient is not nervous/anxious.      Vital Signs: Blood pressure (!) 187/101, pulse 88, temperature 98.5 F (36.9 C), temperature source Oral, resp. rate 14, height _0  (1.803 m), weight 131.5 kg, SpO2 98 %.  Weight trends: Filed Weights   12/21/19 0718  Weight: 131.5 kg    Physical Exam: General: NAD, sitting up in bed in negative pressure room.  Head: Normocephalic, atraumatic.  Eyes: Anicteric, EOMI  Nose: Mucous membranes moist, not inflammed, nonerythematous.  Throat: Oral mucosa moist  Neck: Supple, trachea midline.  Lungs:  Normal respiratory effort.  Heart: Regular  Abdomen:  Soft NTND  Extremities: No pretibial edema.  Neurologic: A&O X3, Motor strength is 5/5 in the all 4 extremities  Skin: No visible rashes, scars.    Lab results: Basic Metabolic Panel: Recent Labs  Lab 12/21/19 1549 12/22/19 0448 12/22/19 1209  NA 136 134* 136  K 5.6* 5.6* 4.9  CL 107 108 107  CO2 22 17* 19*  GLUCOSE 175* 246* 164*  BUN 30* 30* 26*  CREATININE 1.96* 1.62* 1.50*  CALCIUM 8.7* 8.1* 8.6*    Liver Function Tests: Recent Labs  Lab 12/21/19 0719  AST 16  ALT 16  ALKPHOS 56  BILITOT 1.2  PROT 7.6  ALBUMIN 3.6   No results for input(s): LIPASE, AMYLASE in the last 168 hours. No results for input(s): AMMONIA in the last 168 hours.  CBC: Recent Labs  Lab 12/21/19 0719 12/22/19 0448  WBC 3.4* 3.4*  HGB 12.8* 10.9*  HCT 39.4 34.1*  MCV 79.4* 79.3*  PLT 151 108*    Cardiac Enzymes: Recent Labs  Lab 12/21/19 0719  CKTOTAL 86    BNP: Invalid input(s): POCBNP  CBG: Recent Labs  Lab 12/21/19 1547 12/21/19 2156 12/22/19 0721 12/22/19 1114  12/22/19 1541  GLUCAP 155* 134* 264* 187* 182*    Microbiology: No results found for this or any previous visit.  Coagulation Studies: No results for input(s): LABPROT, INR in the last 72 hours.  Urinalysis: No results for input(s): COLORURINE, LABSPEC, PHURINE, GLUCOSEU, HGBUR, BILIRUBINUR, KETONESUR, PROTEINUR, UROBILINOGEN, NITRITE, LEUKOCYTESUR in the last 72 hours.  Invalid input(s): APPERANCEUR    Imaging: DG Chest Port 1 View  Result Date: 12/21/2019 CLINICAL DATA:  Weakness EXAM: PORTABLE CHEST 1 VIEW COMPARISON:  None. FINDINGS: Heart and mediastinal contours are within normal limits. No focal opacities or effusions. No acute bony abnormality. IMPRESSION: No active disease. Electronically Signed   By: Rolm Baptise M.D.   On: 12/21/2019 07:47      Assessment & Plan: Pt is a 43 y.o. male with a PMHx of hypertension, diabetes mellitus type 2 with chronic kidney disease, chronic kidney disease stage II, proteinuria, hyperlipidemia, who was admitted to [facility] on 12/21/2019 for evaluation of muscle cramps.  Patient recently diagnosed with COVID-19 infection on 12/17/2019.  1.  Acute kidney injury/chronic kidney disease stage II/proteinuria/diabetes mellitus type 2 with chronic kidney disease. 2.  Hyperkalemia, improved. 3.  COVID-19 infection.  Plan: It appears that the patient was admitted with cramps.  Suspect electrolyte disturbances led to the muscular cramps.  Continue with IV fluid hydration with 0.9 normal saline at 100 cc/h.  Consider reducing rate tomorrow.  Maintain the patient on Veltassa 16.8 g p.o. daily for now.  If potassium normalized tomorrow consider stopping this.  Long-term he will need follow-up for his underlying chronic kidney disease which we can follow in the office.  Further plan as patient progresses.  Thanks for consultation.

## 2019-12-22 NOTE — Discharge Summary (Addendum)
Physician Discharge Summary   Albert Rice  male DOB: [birth year]  ZOX:[id number]  PCP: Patient, No Pcp Per  Admit date: 12/21/2019 Discharge date: 12/22/2019  Admitted From: home Disposition:  home CODE STATUS: Full code  Discharge Instructions    Discharge instructions   Complete by: As directed    Please follow up with your PCP in about 1 week after discharge to check your potassium level.   Dr. Darlin Priestly [facility] Course:  For full details, please see H&P, progress notes, consult notes and ancillary notes.  Briefly,  Albert Rice is a 44 y.o. male with medical history significant for morbid obesity (BMI 40.45 kg/m2), Diabetes mellitus and hypertension who presented to the emergency room for evaluation of leg cramps.  Patient was diagnosed with COVID-19 viral infection on 12/17/19 and has had symptoms since Tuesday, 12/15/19.  He has had myalgias, diarrhea, loss of taste and smell as well as fever but denies having any vomiting or shortness of breath.    In the eemrgency room for evaluation of leg cramps and found to have significant hyperkalemia of 6.8. Repeat potassium levels were also elevated, so pt was admitted as observation.  Hyperkalemia, resolved Etiology unclear.  No rhabdomyolysis. 12 Lead EKG shows no acute findings.  Pt received dextrose, insulin, calcium gluconate and sodium bicarbonate on presentation.  And also Veltassa and Kayexalate.  K+ normalized to 4.9 prior to discharge.  Pt was advised to follow up with PCP to recheck his potassium level after discharge.  COVID infection Pt was not hypoxic and CXR showed no active disease, so no treatment was initiated.    Morbid obesity (BMI 40)   AKI on CKD III At baseline patient has a serum creatinine of 1.3 and on admission it was 1.7.  Likely some minor dehydration as pt did have diarrhea prior to presentaiton.  Pt received IVF hydration.  Cr improved to 1.5 prior to discharge.  Diabetes mellitus    A1c 8.4.  Metformin held and pt received SSI.  Discharged back to home metformin.   Discharge Diagnoses:  Principal Problem:   Hyperkalemia Active Problems:   Obesity, Class III, BMI 40-49.9 (morbid obesity) ([facility])   Type 2 diabetes mellitus with complication, without long-term current use of insulin [facility])    Discharge Instructions:  Allergies as of 12/22/2019   Not on File     Medication List    TAKE these medications   amLODipine 10 MG tablet Commonly known as: NORVASC Take 10 mg by mouth daily.   cyclobenzaprine 7.5 MG tablet Commonly known as: FEXMID Take 1 tablet (7.5 mg total) by mouth at bedtime as needed for muscle spasms.   lisinopril 20 MG tablet Commonly known as: ZESTRIL Take 20 mg by mouth daily.   metFORMIN 1000 MG tablet Commonly known as: GLUCOPHAGE Take 1,000 mg by mouth 2 (two) times daily with a meal.   pravastatin 20 MG tablet Commonly known as: PRAVACHOL Take 20 mg by mouth daily.         Not on File   The results of significant diagnostics from this hospitalization (including imaging, microbiology, ancillary and laboratory) are listed below for reference.   Consultations:   Procedures/Studies: DG Chest Port 1 View  Result Date: 12/21/2019 CLINICAL DATA:  Weakness EXAM: PORTABLE CHEST 1 VIEW COMPARISON:  None. FINDINGS: Heart and mediastinal contours are within normal limits. No focal opacities or effusions. No acute bony abnormality. IMPRESSION: No active disease. Electronically  Signed   By: Rolm Baptise M.D.   On: 12/21/2019 07:47      Labs: BNP (last 3 results) No results for input(s): BNP in the last 8760 hours. Basic Metabolic Panel: Recent Labs  Lab 12/21/19 0719 12/21/19 1549 12/22/19 0448 12/22/19 1209  NA 137  131* 136 134* 136  K 6.5*  6.8* 5.6* 5.6* 4.9  CL 110  104 107 108 107  CO2 22  21* 22 17* 19*  GLUCOSE 164*  384* 175* 246* 164*  BUN 30*  29* 30* 30* 26*  CREATININE 1.78*  1.62* 1.96* 1.62*  1.50*  CALCIUM 8.2*  8.2* 8.7* 8.1* 8.6*   Liver Function Tests: Recent Labs  Lab 12/21/19 0719  AST 16  ALT 16  ALKPHOS 56  BILITOT 1.2  PROT 7.6  ALBUMIN 3.6   No results for input(s): LIPASE, AMYLASE in the last 168 hours. No results for input(s): AMMONIA in the last 168 hours. CBC: Recent Labs  Lab 12/21/19 0719 12/22/19 0448  WBC 3.4* 3.4*  HGB 12.8* 10.9*  HCT 39.4 34.1*  MCV 79.4* 79.3*  PLT 151 108*   Cardiac Enzymes: Recent Labs  Lab 12/21/19 0719  CKTOTAL 86   BNP: Invalid input(s): POCBNP CBG: Recent Labs  Lab 12/21/19 1439 12/21/19 1547 12/21/19 2156 12/22/19 0721 12/22/19 1114  GLUCAP 167* 155* 134* 264* 187*   D-Dimer No results for input(s): DDIMER in the last 72 hours. Hgb A1c Recent Labs    12/21/19 0719  HGBA1C 8.4*   Lipid Profile No results for input(s): CHOL, HDL, LDLCALC, TRIG, CHOLHDL, LDLDIRECT in the last 72 hours. Thyroid function studies No results for input(s): TSH, T4TOTAL, T3FREE, THYROIDAB in the last 72 hours.  Invalid input(s): FREET3 Anemia work up No results for input(s): VITAMINB12, FOLATE, FERRITIN, TIBC, IRON, RETICCTPCT in the last 72 hours. Urinalysis    Component Value Date/Time   COLORURINE YELLOW (A) 06/15/2019 1518   APPEARANCEUR CLOUDY (A) 06/15/2019 1518   LABSPEC 1.010 06/15/2019 1518   PHURINE 5.0 06/15/2019 1518   GLUCOSEU NEGATIVE 06/15/2019 1518   HGBUR NEGATIVE 06/15/2019 1518   BILIRUBINUR NEGATIVE 06/15/2019 1518   KETONESUR NEGATIVE 06/15/2019 1518   PROTEINUR 100 (A) 06/15/2019 1518   NITRITE NEGATIVE 06/15/2019 1518   LEUKOCYTESUR NEGATIVE 06/15/2019 1518   Sepsis Labs Invalid input(s): PROCALCITONIN,  WBC,  LACTICIDVEN Microbiology No results found for this or any previous visit (from the past 240 hour(s)).   Total time spend on discharging this patient, including the last patient exam, discussing the hospital stay, instructions for ongoing care as it relates to all pertinent  caregivers, as well as preparing the medical discharge records, prescriptions, and/or referrals as applicable, is 30 minutes.    Enzo Bi, MD  [facility] 12/22/2019, 1:50 PM  If 7PM-7AM, please contact night-coverage

## 2019-12-31 ENCOUNTER — Other Ambulatory Visit: Payer: Self-pay

## 2020-01-06 LAB — BLOOD GAS, VENOUS
Acid-base deficit: 3 mmol/L — ABNORMAL HIGH (ref 0.0–2.0)
Bicarbonate: 22.7 mmol/L (ref 20.0–28.0)
O2 Saturation: 54.8 %
Patient temperature: 37
pCO2, Ven: 42 mmHg — ABNORMAL LOW (ref 44.0–60.0)
pH, Ven: 7.34 (ref 7.250–7.430)

## 2020-03-14 ENCOUNTER — Encounter: Payer: Self-pay | Admitting: Emergency Medicine

## 2020-03-14 ENCOUNTER — Emergency Department: Payer: BC Managed Care – PPO

## 2020-03-14 ENCOUNTER — Inpatient Hospital Stay
Admission: EM | Admit: 2020-03-14 | Discharge: 2020-03-18 | DRG: 417 | Disposition: A | Discharge status: Home / Self Care | Payer: BC Managed Care – PPO | Attending: Internal Medicine | Admitting: Internal Medicine

## 2020-03-14 ENCOUNTER — Other Ambulatory Visit: Payer: Self-pay

## 2020-03-14 DIAGNOSIS — T464X5A Adverse effect of angiotensin-converting-enzyme inhibitors, initial encounter: Secondary | ICD-10-CM | POA: Diagnosis present

## 2020-03-14 DIAGNOSIS — R7401 Elevation of levels of liver transaminase levels: Secondary | ICD-10-CM | POA: Diagnosis present

## 2020-03-14 DIAGNOSIS — K8001 Calculus of gallbladder with acute cholecystitis with obstruction: Secondary | ICD-10-CM

## 2020-03-14 DIAGNOSIS — N179 Acute kidney failure, unspecified: Secondary | ICD-10-CM | POA: Diagnosis present

## 2020-03-14 DIAGNOSIS — Z79899 Other long term (current) drug therapy: Secondary | ICD-10-CM

## 2020-03-14 DIAGNOSIS — Z8616 Personal history of COVID-19: Secondary | ICD-10-CM

## 2020-03-14 DIAGNOSIS — Z6841 Body Mass Index (BMI) 40.0 and over, adult: Secondary | ICD-10-CM | POA: Diagnosis not present

## 2020-03-14 DIAGNOSIS — E118 Type 2 diabetes mellitus with unspecified complications: Secondary | ICD-10-CM | POA: Diagnosis present

## 2020-03-14 DIAGNOSIS — I129 Hypertensive chronic kidney disease with stage 1 through stage 4 chronic kidney disease, or unspecified chronic kidney disease: Secondary | ICD-10-CM | POA: Diagnosis present

## 2020-03-14 DIAGNOSIS — U071 COVID-19: Secondary | ICD-10-CM | POA: Diagnosis present

## 2020-03-14 DIAGNOSIS — D638 Anemia in other chronic diseases classified elsewhere: Secondary | ICD-10-CM | POA: Diagnosis not present

## 2020-03-14 DIAGNOSIS — E875 Hyperkalemia: Secondary | ICD-10-CM | POA: Diagnosis not present

## 2020-03-14 DIAGNOSIS — K8000 Calculus of gallbladder with acute cholecystitis without obstruction: Principal | ICD-10-CM | POA: Diagnosis present

## 2020-03-14 DIAGNOSIS — Z7984 Long term (current) use of oral hypoglycemic drugs: Secondary | ICD-10-CM

## 2020-03-14 DIAGNOSIS — E785 Hyperlipidemia, unspecified: Secondary | ICD-10-CM | POA: Diagnosis present

## 2020-03-14 DIAGNOSIS — D631 Anemia in chronic kidney disease: Secondary | ICD-10-CM | POA: Diagnosis present

## 2020-03-14 DIAGNOSIS — E1122 Type 2 diabetes mellitus with diabetic chronic kidney disease: Secondary | ICD-10-CM | POA: Diagnosis present

## 2020-03-14 DIAGNOSIS — K8020 Calculus of gallbladder without cholecystitis without obstruction: Secondary | ICD-10-CM | POA: Diagnosis present

## 2020-03-14 DIAGNOSIS — N1831 Chronic kidney disease, stage 3a: Secondary | ICD-10-CM | POA: Diagnosis present

## 2020-03-14 DIAGNOSIS — Z7982 Long term (current) use of aspirin: Secondary | ICD-10-CM

## 2020-03-14 DIAGNOSIS — R109 Unspecified abdominal pain: Secondary | ICD-10-CM | POA: Diagnosis present

## 2020-03-14 DIAGNOSIS — R1013 Epigastric pain: Secondary | ICD-10-CM | POA: Diagnosis not present

## 2020-03-14 DIAGNOSIS — K810 Acute cholecystitis: Secondary | ICD-10-CM | POA: Diagnosis not present

## 2020-03-14 DIAGNOSIS — R112 Nausea with vomiting, unspecified: Secondary | ICD-10-CM

## 2020-03-14 LAB — BASIC METABOLIC PANEL
Anion gap: 6 (ref 5–15)
BUN: 28 mg/dL — ABNORMAL HIGH (ref 6–20)
CO2: 18 mmol/L — ABNORMAL LOW (ref 22–32)
Calcium: 8.6 mg/dL — ABNORMAL LOW (ref 8.9–10.3)
Chloride: 114 mmol/L — ABNORMAL HIGH (ref 98–111)
Creatinine, Ser: 1.31 mg/dL — ABNORMAL HIGH (ref 0.61–1.24)
GFR calc Af Amer: >60 mL/min (ref >60)
GFR calc non Af Amer: >60 mL/min (ref >60)
Glucose, Bld: 191 mg/dL — ABNORMAL HIGH (ref 70–99)
Potassium: 6.5 mmol/L (ref 3.5–5.1)
Sodium: 138 mmol/L (ref 135–145)

## 2020-03-14 LAB — CBC
HCT: 37.7 % — ABNORMAL LOW (ref 39.0–52.0)
Hemoglobin: 12.6 g/dL — ABNORMAL LOW (ref 13.0–17.0)
MCH: 26.1 pg (ref 26.0–34.0)
MCHC: 33.4 g/dL (ref 30.0–36.0)
MCV: 78.2 fL — ABNORMAL LOW (ref 80.0–100.0)
Platelets: 182 10*3/uL (ref 150–400)
RBC: 4.82 MIL/uL (ref 4.22–5.81)
RDW: 14.1 % (ref 11.5–15.5)
WBC: 8.2 10*3/uL (ref 4.0–10.5)
nRBC: 0 % (ref 0.0–0.2)

## 2020-03-14 LAB — COMPREHENSIVE METABOLIC PANEL
ALT: 13 U/L (ref 0–44)
AST: 13 U/L — ABNORMAL LOW (ref 15–41)
Albumin: 3.7 g/dL (ref 3.5–5.0)
Alkaline Phosphatase: 42 U/L (ref 38–126)
Anion gap: 7 (ref 5–15)
BUN: 31 mg/dL — ABNORMAL HIGH (ref 6–20)
CO2: 18 mmol/L — ABNORMAL LOW (ref 22–32)
Calcium: 8.7 mg/dL — ABNORMAL LOW (ref 8.9–10.3)
Chloride: 111 mmol/L (ref 98–111)
Creatinine, Ser: 1.41 mg/dL — ABNORMAL HIGH (ref 0.61–1.24)
GFR calc Af Amer: >60 mL/min (ref >60)
GFR calc non Af Amer: >60 mL/min (ref >60)
Glucose, Bld: 257 mg/dL — ABNORMAL HIGH (ref 70–99)
Potassium: 5.9 mmol/L — ABNORMAL HIGH (ref 3.5–5.1)
Sodium: 136 mmol/L (ref 135–145)
Total Bilirubin: 1.1 mg/dL (ref 0.3–1.2)
Total Protein: 6.7 g/dL (ref 6.5–8.1)

## 2020-03-14 LAB — LIPASE, BLOOD: Lipase: 26 U/L (ref 11–51)

## 2020-03-14 LAB — URINALYSIS, COMPLETE (UACMP) WITH MICROSCOPIC
Bacteria, UA: NONE SEEN
Bilirubin Urine: NEGATIVE
Glucose, UA: NEGATIVE mg/dL
Ketones, ur: NEGATIVE mg/dL
Leukocytes,Ua: NEGATIVE
Nitrite: NEGATIVE
Protein, ur: 100 mg/dL — AB
Specific Gravity, Urine: 1.02 (ref 1.005–1.030)
Squamous Epithelial / HPF: NONE SEEN (ref 0–5)
pH: 6 (ref 5.0–8.0)

## 2020-03-14 LAB — CK: Total CK: 227 U/L (ref 49–397)

## 2020-03-14 LAB — SARS CORONAVIRUS 2 BY RT PCR (HOSPITAL ORDER, PERFORMED IN ~~LOC~~ HOSPITAL LAB): SARS Coronavirus 2: POSITIVE — AB

## 2020-03-14 LAB — GLUCOSE, CAPILLARY: Glucose-Capillary: 107 mg/dL — ABNORMAL HIGH (ref 70–99)

## 2020-03-14 MED ORDER — IOHEXOL 300 MG/ML  SOLN
125.0000 mL | Freq: Once | INTRAMUSCULAR | Status: AC | PRN
  Administered 2020-03-14: 125 mL via INTRAVENOUS

## 2020-03-14 MED ORDER — SODIUM CHLORIDE 0.9 % IV BOLUS
1000.0000 mL | Freq: Once | INTRAVENOUS | Status: AC
  Administered 2020-03-14: 1000 mL via INTRAVENOUS

## 2020-03-14 MED ORDER — DROPERIDOL 2.5 MG/ML IJ SOLN
2.5000 mg | Freq: Once | INTRAMUSCULAR | Status: AC
  Administered 2020-03-14: 2.5 mg via INTRAVENOUS
  Filled 2020-03-14: qty 2

## 2020-03-14 MED ORDER — ONDANSETRON 4 MG PO TBDP
4.0000 mg | ORAL_TABLET | Freq: Once | ORAL | Status: AC | PRN
  Administered 2020-03-14: 4 mg via ORAL
  Filled 2020-03-14: qty 1

## 2020-03-14 MED ORDER — ALUM & MAG HYDROXIDE-SIMETH 200-200-20 MG/5ML PO SUSP
15.0000 mL | Freq: Once | ORAL | Status: AC
  Administered 2020-03-14: 15 mL via ORAL
  Filled 2020-03-14: qty 30

## 2020-03-14 MED ORDER — PATIROMER SORBITEX CALCIUM 8.4 G PO PACK
8.4000 g | PACK | Freq: Every day | ORAL | Status: DC
  Administered 2020-03-14 – 2020-03-18 (×2): 8.4 g via ORAL
  Filled 2020-03-14 (×6): qty 1

## 2020-03-14 MED ORDER — SODIUM BICARBONATE 8.4 % IV SOLN
50.0000 meq | Freq: Once | INTRAVENOUS | Status: AC
  Administered 2020-03-14: 50 meq via INTRAVENOUS
  Filled 2020-03-14: qty 50

## 2020-03-14 MED ORDER — INSULIN ASPART 100 UNIT/ML ~~LOC~~ SOLN
SUBCUTANEOUS | Status: AC
  Administered 2020-03-14: 10 [IU] via INTRAVENOUS
  Filled 2020-03-14: qty 1

## 2020-03-14 MED ORDER — ALBUTEROL SULFATE (2.5 MG/3ML) 0.083% IN NEBU
INHALATION_SOLUTION | RESPIRATORY_TRACT | Status: AC
  Administered 2020-03-14: 10 mg via RESPIRATORY_TRACT
  Filled 2020-03-14: qty 12

## 2020-03-14 MED ORDER — ALBUTEROL SULFATE (2.5 MG/3ML) 0.083% IN NEBU
10.0000 mg | INHALATION_SOLUTION | Freq: Once | RESPIRATORY_TRACT | Status: AC
  Filled 2020-03-14: qty 12

## 2020-03-14 MED ORDER — LIDOCAINE VISCOUS HCL 2 % MT SOLN
15.0000 mL | Freq: Once | OROMUCOSAL | Status: AC
  Administered 2020-03-14: 15 mL via ORAL
  Filled 2020-03-14: qty 15

## 2020-03-14 MED ORDER — MORPHINE SULFATE (PF) 4 MG/ML IV SOLN
4.0000 mg | Freq: Once | INTRAVENOUS | Status: AC
  Administered 2020-03-14: 4 mg via INTRAVENOUS
  Filled 2020-03-14: qty 1

## 2020-03-14 MED ORDER — ONDANSETRON HCL 4 MG/2ML IJ SOLN
4.0000 mg | Freq: Once | INTRAMUSCULAR | Status: AC
  Administered 2020-03-14: 4 mg via INTRAVENOUS
  Filled 2020-03-14: qty 2

## 2020-03-14 MED ORDER — DEXTROSE 50 % IV SOLN
1.0000 | Freq: Once | INTRAVENOUS | Status: AC
  Administered 2020-03-14: 50 mL via INTRAVENOUS
  Filled 2020-03-14: qty 50

## 2020-03-14 MED ORDER — INSULIN ASPART 100 UNIT/ML IV SOLN
10.0000 [IU] | Freq: Once | INTRAVENOUS | Status: AC
  Filled 2020-03-14: qty 0.1

## 2020-03-14 NOTE — ED Triage Notes (Addendum)
C/o upper mid abdominal pain starting at 330 am today.  + vomiting X 7 today. No diarrhea per pt. No fever. VSS at this time.  Denies alcohol.

## 2020-03-14 NOTE — ED Notes (Signed)
Date and time results received: 03/14/20 1937  Test: Potassium Critical Value: 6.5  Name of Provider Notified: Dr. Larinda Buttery, MD

## 2020-03-14 NOTE — ED Notes (Signed)
This RN stepped out of the room after administering GI cocktail. Pt vomited. Dr. Larinda Buttery, MD notified.

## 2020-03-14 NOTE — ED Notes (Signed)
Martie Lee, pt wife [phone]. Would like to be notified when pt has a room upstairs.

## 2020-03-14 NOTE — H&P (Signed)
History and Physical   Albert Rice YFV:[id number] DOB: February 12, 1976 DOA: 03/14/2020  Referring MD/NP/PA: Dr Larinda Buttery  PCP: Patient, No Pcp Per   Outpatient Specialists: None   Patient coming from: Home  Chief Complaint: Abdominal Pain  HPI: Albert Rice is a 44 y.o. male with medical history significant of diabetes, hypertension, hyperlipidemia, chronic kidney disease stage III, recent hyperkalemia who presented to the ER with abdominal pain nausea vomiting.  Symptoms started apparently today.  Pain was rated a 7 out of 10 in his epigastric region.  No significant radiation.  No association with diet.  Patient seen in the ER evaluated and found to have potassium of more than 6.  He had a similar episode back in March which was treated.  No other corresponding source.  Nephrology consulted and recommends admission with treatment of his hyperkalemia acutely.  Patient equally had cholelithiasis on abdominal ultrasound which may be responsible for his abdominal pain.  General surgery consulted and recommends HIDA scan and will follow.  Is being admitted to the medical service therefore..  ED Course: Temperature 98 blood pressure 136/98 pulse 109 respiratory 20 oxygen sat 95% on room air.  Sodium 138 potassium 6.5 chloride 114 CO2 of 18 glucose 257 BUN 31 creatinine 1.41 and the rest of the LFTs are within normal also CBC entirely within normal.  Urinalysis negative.  Abdominal ultrasound showed cholelithiasis.  CT abdomen pelvis essentially within normal.  Patient received Veltassa, bicarbonate, insulin and IV fluids.  He is being admitted for further work-up and evaluation.  Review of Systems: As per HPI otherwise 10 point review of systems negative.    Past Medical History:  Diagnosis Date  . Diabetes mellitus without complication (HCC)   . Hyperlipemia   . Hypertension     History reviewed. No pertinent surgical history.   reports that he has never smoked. He has never used smokeless  tobacco. He reports that he does not drink alcohol or use drugs.  No Known Allergies  History reviewed. No pertinent family history.   Prior to Admission medications   Medication Sig Start Date End Date Taking? Authorizing Provider  amLODipine (NORVASC) 10 MG tablet Take 10 mg by mouth daily.   Yes [provider]  aspirin 81 MG EC tablet Take 81 mg by mouth daily at 12 noon.   Yes [provider]  chlorthalidone (HYGROTON) 25 MG tablet Take 25 mg by mouth daily. 02/01/20  Yes [provider]  lisinopril (ZESTRIL) 20 MG tablet Take 20 mg by mouth daily.   Yes [provider]  metFORMIN (GLUCOPHAGE) 1000 MG tablet Take 1,000 mg by mouth 2 (two) times daily with a meal.   Yes [provider]  pravastatin (PRAVACHOL) 20 MG tablet Take 20 mg by mouth daily.   Yes [provider]  tamsulosin (FLOMAX) 0.4 MG CAPS capsule Take 0.4 mg by mouth daily. 01/22/20  Yes [provider]  cyclobenzaprine (FEXMID) 7.5 MG tablet Take 1 tablet (7.5 mg total) by mouth at bedtime as needed for muscle spasms. Patient not taking: Reported on 03/14/2020 06/15/19   Sharyn Creamer, MD  tadalafil (CIALIS) 5 MG tablet Take 5 mg by mouth daily. 02/06/20   [provider]    Physical Exam: Vitals:   03/14/20 1522 03/14/20 1530 03/14/20 1813 03/14/20 2115  BP: 137/75 (!) 153/87 (!) 155/75 (!) 136/98  Pulse: 73 82 92 (!) 109  Resp: 20  20 20   Temp:      TempSrc:  SpO2: 99% 98% 99% 95%  Weight:      Height:          Constitutional: Morbidly obese, no distress Vitals:   03/14/20 1522 03/14/20 1530 03/14/20 1813 03/14/20 2115  BP: 137/75 (!) 153/87 (!) 155/75 (!) 136/98  Pulse: 73 82 92 (!) 109  Resp: 20  20 20   Temp:      TempSrc:      SpO2: 99% 98% 99% 95%  Weight:      Height:       Eyes: PERRL, lids and conjunctivae normal ENMT: Mucous membranes are moist. Posterior pharynx clear of any exudate or lesions.Normal dentition.  Neck:  normal, supple, no masses, no thyromegaly Respiratory: clear to auscultation bilaterally, no wheezing, no crackles. Normal respiratory effort. No accessory muscle use.  Cardiovascular: Regular rate and rhythm, no murmurs / rubs / gallops. No extremity edema. 2+ pedal pulses. No carotid bruits.  Abdomen: Diffuse epigastric tenderness, no masses palpated. No hepatosplenomegaly. Bowel sounds positive.  Musculoskeletal: no clubbing / cyanosis. No joint deformity upper and lower extremities. Good ROM, no contractures. Normal muscle tone.  Skin: no rashes, lesions, ulcers. No induration Neurologic: CN 2-12 grossly intact. Sensation intact, DTR normal. Strength 5/5 in all 4.  Psychiatric: Normal judgment and insight. Alert and oriented x 3. Normal mood.     Labs on Admission: I have personally reviewed following labs and imaging studies  CBC: Recent Labs  Lab 03/14/20 1321  WBC 8.2  HGB 12.6*  HCT 37.7*  MCV 78.2*  PLT 182   Basic Metabolic Panel: Recent Labs  Lab 03/14/20 1321 03/14/20 1818  NA 136 138  K 5.9* 6.5*  CL 111 114*  CO2 18* 18*  GLUCOSE 257* 191*  BUN 31* 28*  CREATININE 1.41* 1.31*  CALCIUM 8.7* 8.6*   GFR: Estimated Creatinine Clearance: 103.4 mL/min (A) (by C-G formula based on SCr of 1.31 mg/dL (H)). Liver Function Tests: Recent Labs  Lab 03/14/20 1321  AST 13*  ALT 13  ALKPHOS 42  BILITOT 1.1  PROT 6.7  ALBUMIN 3.7   Recent Labs  Lab 03/14/20 1321  LIPASE 26   No results for input(s): AMMONIA in the last 168 hours. Coagulation Profile: No results for input(s): INR, PROTIME in the last 168 hours. Cardiac Enzymes: Recent Labs  Lab 03/14/20 1321  CKTOTAL 227   BNP (last 3 results) No results for input(s): PROBNP in the last 8760 hours. HbA1C: No results for input(s): HGBA1C in the last 72 hours. CBG: Recent Labs  Lab 03/14/20 2125  GLUCAP 107*   Lipid Profile: No results for input(s): CHOL, HDL, LDLCALC, TRIG, CHOLHDL, LDLDIRECT in  the last 72 hours. Thyroid Function Tests: No results for input(s): TSH, T4TOTAL, FREET4, T3FREE, THYROIDAB in the last 72 hours. Anemia Panel: No results for input(s): VITAMINB12, FOLATE, FERRITIN, TIBC, IRON, RETICCTPCT in the last 72 hours. Urine analysis:    Component Value Date/Time   COLORURINE STRAW (A) 03/14/2020 2009   APPEARANCEUR CLEAR (A) 03/14/2020 2009   LABSPEC 1.020 03/14/2020 2009   PHURINE 6.0 03/14/2020 2009   GLUCOSEU NEGATIVE 03/14/2020 2009   HGBUR SMALL (A) 03/14/2020 2009   BILIRUBINUR NEGATIVE 03/14/2020 2009   KETONESUR NEGATIVE 03/14/2020 2009   PROTEINUR 100 (A) 03/14/2020 2009   NITRITE NEGATIVE 03/14/2020 2009   LEUKOCYTESUR NEGATIVE 03/14/2020 2009   Sepsis Labs: @LABRCNTIP (procalcitonin:4,lacticidven:4) ) Recent Results (from the past 240 hour(s))  SARS Coronavirus 2 by RT PCR (hospital order, performed in [facility] hospital lab)  Nasopharyngeal Nasopharyngeal Swab     Status: Abnormal   Collection Time: 03/14/20  8:09 PM   Specimen: Nasopharyngeal Swab  Result Value Ref Range Status   SARS Coronavirus 2 POSITIVE (A) NEGATIVE Final    Comment: RESULT CALLED TO, READ BACK BY AND VERIFIED WITH: ASHLEY MURRAY @2135  03/14/20 AKT (NOTE) SARS-CoV-2 target nucleic acids are DETECTED SARS-CoV-2 RNA is generally detectable in upper respiratory specimens  during the acute phase of infection.  Positive results are indicative  of the presence of the identified virus, but do not rule out bacterial infection or co-infection with other pathogens not detected by the test.  Clinical correlation with patient history and  other diagnostic information is necessary to determine patient infection status.  The expected result is negative. Fact Sheet for Patients:   03/16/20  Fact Sheet for Healthcare Providers:   BoilerBrush.com.cy   This test is not yet approved or cleared by the https://pope.com/ FDA and  has  been authorized for detection and/or diagnosis of SARS-CoV-2 by FDA under an Emergency Use Authorization (EUA).  This EUA will remain in effect (meaning this test can be u sed) for the duration of  the COVID-19 declaration under Section 564(b)(1) of the Act, 21 U.S.C. section 360-bbb-3(b)(1), unless the authorization is terminated or revoked sooner. Performed at [facility], [address] [place], [place]      Radiological Exams on Admission: CT Abdomen Pelvis W Contrast  Result Date: 03/14/2020 CLINICAL DATA:  Acute abdominal pain.  Non localized. EXAM: CT ABDOMEN AND PELVIS WITH CONTRAST TECHNIQUE: Multidetector CT imaging of the abdomen and pelvis was performed using the standard protocol following bolus administration of intravenous contrast. CONTRAST:  03/16/2020 OMNIPAQUE IOHEXOL 300 MG/ML  SOLN COMPARISON:  06/15/2019 FINDINGS: Lower chest: No acute abnormality. Hepatobiliary: Normal liver. Cholelithiasis. No intrahepatic or extrahepatic biliary ductal dilatation. Pancreas: Unremarkable. No pancreatic ductal dilatation or surrounding inflammatory changes. Spleen: Normal in size without focal abnormality. Adrenals/Urinary Tract: Normal adrenal glands. Hypodense masses throughout bilateral kidneys with the larger masses measuring fluid attenuation. The largest mass in the upper pole of the right kidney measures 2.9 cm. No obstructive uropathy. Stomach/Bowel: Stomach is within normal limits. Appendix appears normal. No evidence of bowel wall thickening, distention, or inflammatory changes. Vascular/Lymphatic: No significant vascular findings are present. No enlarged abdominal or pelvic lymph nodes. Reproductive: Prostate is unremarkable. Other: No abdominal wall hernia or abnormality. No abdominopelvic ascites. Musculoskeletal: No acute osseous abnormality. No aggressive osseous lesion. Degenerative disease with disc height loss at L4-5 and L5-S1. IMPRESSION: 1. No acute abdominal  or pelvic pathology. 2. Cholelithiasis. 3. Normal appendix. 4. Bilateral renal cysts. Electronically Signed   By: 06/17/2019   On: 03/14/2020 16:17   03/16/2020 Abdomen Limited RUQ  Result Date: 03/14/2020 CLINICAL DATA:  Abdominal pain in the right upper quadrant and nausea starting this morning. EXAM: ULTRASOUND ABDOMEN LIMITED RIGHT UPPER QUADRANT COMPARISON:  CT abdomen pelvis 03/14/2020 FINDINGS: Gallbladder: There are multiple gallstones. There is no gallbladder wall thickening. No pericholecystic fluid. Positive sonographic Murphy sign. Common bile duct: Diameter: 0.5 cm, within normal limits. Liver: No focal lesion identified. Within normal limits in parenchymal echogenicity. Portal vein is patent on color Doppler imaging with normal direction of blood flow towards the liver. Other: None. IMPRESSION: Cholelithiasis and positive sonographic Murphy sign. No gallbladder wall thickening or pericholecystic fluid. Findings are indeterminate for acute cholecystitis. A nuclear medicine HIDA scan could be performed for further evaluation. Electronically Signed   By: 03/16/2020  Dimas Aguas M.D.   On: 03/14/2020 18:17    EKG: Independently reviewed.  It shows low voltage EKG with sinus tachycardia rate of 105, no significant ST changes  Assessment/Plan Principal Problem:   Hyperkalemia Active Problems:   Obesity, Class III, BMI 40-49.9 (morbid obesity) (HCC)   Type 2 diabetes mellitus with complication, without long-term current use of insulin (HCC)   Cholelithiasis     #1 hyperkalemia: Causes unknown.  Patient is not on any supplement.  Not on any ARB or ACE inhibitors.  He has had this before and was treated.  Currently treated as above.  Nephrology consulted.  Monitor potassium level.  May need to get on Kayexalate and follow nephrology recommendations.  #2 diabetes: Confirm home regimen and resume.  Add sliding scale insulin.  #3 cholelithiasis: Dr. Celine Ahr of general surgery will follow.  In the  meantime we will get a HIDA scan.  #4 morbid obesity: Dietary counseling.  #5 hypertension: Continue blood pressure control.  Any further treatment depends on patient's response.   DVT prophylaxis: Lovenox Code Status: Full code Family Communication: No patient at bedside Disposition Plan: Home Consults called: Dr. Celine Ahr, general surgery.  Dr. Zollie Scale nephrology Admission status: Inpatient  Severity of Illness: The appropriate patient status for this patient is INPATIENT. Inpatient status is judged to be reasonable and necessary in order to provide the required intensity of service to ensure the patient's safety. The patient's presenting symptoms, physical exam findings, and initial radiographic and laboratory data in the context of their chronic comorbidities is felt to place them at high risk for further clinical deterioration. Furthermore, it is not anticipated that the patient will be medically stable for discharge from the hospital within 2 midnights of admission. The following factors support the patient status of inpatient.   " The patient's presenting symptoms include abdominal pain. " The worrisome physical exam findings include" abdominal tenderness. " The initial radiographic and laboratory data are worrisome because of cholelithiasis and potassium 6.5. " The chronic co-morbidities include diabetes and hypertension with hyperlipidemia.   * I certify that at the point of admission it is my clinical judgment that the patient will require inpatient hospital care spanning beyond 2 midnights from the point of admission due to high intensity of service, high risk for further deterioration and high frequency of surveillance required.Barbette Merino MD [facility] Pager [phone]  If 7PM-7AM, please contact night-coverage [email or web address] Password [email or web address]  03/14/2020, 11:22 PM

## 2020-03-14 NOTE — ED Notes (Signed)
Pt present to the ED for epigastric pain since 0430 this am. Pt states it woke him up out of his sleep. Also c/o nausea and vomiting. Denies diarrhea. Denies CP, SOB, and fever. No hx of abdominal surgery per pt.

## 2020-03-14 NOTE — ED Provider Notes (Signed)
[facility] Emergency Department Provider Note   ____________________________________________   First MD Initiated Contact with Patient 03/14/20 1514     (approximate)  I have reviewed the triage vital signs and the nursing notes.   HISTORY  Chief Complaint Abdominal Pain    HPI Pilot Prindle is a 44 y.o. male with past medical history of hypertension, hyperlipidemia, and diabetes who presents to the ED complaining of abdominal pain.  Patient reports that he has had constant pain in his epigastrium since around 430 this morning.  He describes it as a sharp and squeezing pain that has been associated with nausea and multiple episodes of vomiting.  He denies any episodes of similar pain in the past and has not had any diarrhea or other changes in his bowel movements.  He has not had any fevers and denies any cough, chest pain, or shortness of breath.  He denies any alcohol or drug consumption, does not take NSAIDs or steroids.        Past Medical History:  Diagnosis Date  . Diabetes mellitus without complication (HCC)   . Hyperlipemia   . Hypertension     Patient Active Problem List   Diagnosis Date Noted  . Cholelithiasis 03/14/2020  . Hyperkalemia 12/21/2019  . Obesity, Class III, BMI 40-49.9 (morbid obesity) (HCC) 12/21/2019  . Type 2 diabetes mellitus with complication, without long-term current use of insulin (HCC) 12/21/2019    History reviewed. No pertinent surgical history.  Prior to Admission medications   Medication Sig Start Date End Date Taking? Authorizing Provider  amLODipine (NORVASC) 10 MG tablet Take 10 mg by mouth daily.   Yes [provider]  aspirin 81 MG EC tablet Take 81 mg by mouth daily at 12 noon.   Yes [provider]  chlorthalidone (HYGROTON) 25 MG tablet Take 25 mg by mouth daily. 02/01/20  Yes [provider]  lisinopril (ZESTRIL) 20 MG tablet Take 20 mg by mouth daily.   Yes [provider]  metFORMIN (GLUCOPHAGE) 1000 MG tablet Take 1,000 mg by mouth 2 (two) times daily with a meal.   Yes [provider]  pravastatin (PRAVACHOL) 20 MG tablet Take 20 mg by mouth daily.   Yes [provider]  tamsulosin (FLOMAX) 0.4 MG CAPS capsule Take 0.4 mg by mouth daily. 01/22/20  Yes [provider]  cyclobenzaprine (FEXMID) 7.5 MG tablet Take 1 tablet (7.5 mg total) by mouth at bedtime as needed for muscle spasms. Patient not taking: Reported on 03/14/2020 06/15/19   Sharyn Creamer, MD  tadalafil (CIALIS) 5 MG tablet Take 5 mg by mouth daily. 02/06/20   [provider]    Allergies Patient has no known allergies.  History reviewed. No pertinent family history.  Social History Social History   Tobacco Use  . Smoking status: Never Smoker  . Smokeless tobacco: Never Used  Substance Use Topics  . Alcohol use: Never  . Drug use: Never    Review of Systems  Constitutional: No fever/chills Eyes: No visual changes. ENT: No sore throat. Cardiovascular: Denies chest pain. Respiratory: Denies shortness of breath. Gastrointestinal: Positive for abdominal pain, nausea, and vomiting.  No diarrhea.  No constipation. Genitourinary: Negative for dysuria. Musculoskeletal: Negative for back pain. Skin: Negative for rash. Neurological: Negative for headaches, focal weakness or numbness.  ____________________________________________   PHYSICAL EXAM:  VITAL SIGNS: ED Triage Vitals  Enc Vitals Group     BP 03/14/20 1309 (!) 145/77  Pulse Rate 03/14/20 1309 74     Resp 03/14/20 1309 16     Temp 03/14/20 1309 98 F (36.7 C)     Temp Source 03/14/20 1309 Oral     SpO2 03/14/20 1309 100 %     Weight 03/14/20 1307 (!) 305 lb (138.3 kg)     Height 03/14/20 1307 5\' 11"  (1.803 m)     Head Circumference --      Peak Flow --      Pain Score 03/14/20 1306 10     Pain Loc --      Pain Edu? --      Excl. in [place]? --     Constitutional: Alert  and oriented. Eyes: Conjunctivae are normal. Head: Atraumatic. Nose: No congestion/rhinnorhea. Mouth/Throat: Mucous membranes are moist. Neck: Normal ROM Cardiovascular: Normal rate, regular rhythm. Grossly normal heart sounds. Respiratory: Normal respiratory effort.  No retractions. Lungs CTAB. Gastrointestinal: Soft and tender to palpation in the left upper and right upper quadrants with no rebound or guarding. No distention. Genitourinary: deferred Musculoskeletal: No lower extremity tenderness nor edema. Neurologic:  Normal speech and language. No gross focal neurologic deficits are appreciated. Skin:  Skin is warm, dry and intact. No rash noted. Psychiatric: Mood and affect are normal. Speech and behavior are normal.  ____________________________________________   LABS (all labs ordered are listed, but only abnormal results are displayed)  Labs Reviewed  COMPREHENSIVE METABOLIC PANEL - Abnormal; Notable for the following components:      Result Value   Potassium 5.9 (*)    CO2 18 (*)    Glucose, Bld 257 (*)    BUN 31 (*)    Creatinine, Ser 1.41 (*)    Calcium 8.7 (*)    AST 13 (*)    All other components within normal limits  CBC - Abnormal; Notable for the following components:   Hemoglobin 12.6 (*)    HCT 37.7 (*)    MCV 78.2 (*)    All other components within normal limits  BASIC METABOLIC PANEL - Abnormal; Notable for the following components:   Potassium 6.5 (*)    Chloride 114 (*)    CO2 18 (*)    Glucose, Bld 191 (*)    BUN 28 (*)    Creatinine, Ser 1.31 (*)    Calcium 8.6 (*)    All other components within normal limits  SARS CORONAVIRUS 2 BY RT PCR (HOSPITAL ORDER, [facility])  LIPASE, BLOOD  CK  URINALYSIS, COMPLETE (UACMP) WITH MICROSCOPIC   ____________________________________________  EKG  ED ECG REPORT I, Blake Divine, the attending physician, personally viewed and interpreted this ECG.   Date: 03/14/2020  EKG  Time: 13:13  Rate: 79  Rhythm: normal sinus rhythm  Axis: Normal  Intervals:none  ST&T Change: None  ED ECG REPORT I, Blake Divine, the attending physician, personally viewed and interpreted this ECG.   Date: 03/14/2020  EKG Time: 19:27  Rate: 105  Rhythm: sinus tachycardia  Axis: RAD  Intervals:none  ST&T Change: None    PROCEDURES  Procedure(s) performed (including Critical Care):  .Critical Care Performed by: Blake Divine, MD Authorized by: Blake Divine, MD   Critical care provider statement:    Critical care time (minutes):  45   Critical care time was exclusive of:  Separately billable procedures and treating other patients and teaching time   Critical care was necessary to treat or prevent imminent or life-threatening deterioration of the following conditions:  Metabolic  crisis   Critical care was time spent personally by me on the following activities:  Discussions with consultants, evaluation of patient's response to treatment, examination of patient, ordering and performing treatments and interventions, ordering and review of laboratory studies, ordering and review of radiographic studies, pulse oximetry, re-evaluation of patient's condition, obtaining history from patient or surrogate and review of old charts   I assumed direction of critical care for this patient from another provider in my specialty: no       ____________________________________________   INITIAL IMPRESSION / ASSESSMENT AND PLAN / ED COURSE       43 year old male with history of hypertension, hyperlipidemia, and diabetes presents to the ED with constant bilateral upper quadrant abdominal pain since 430 this morning associated with nausea and vomiting.  He has tenderness on exam and we will further assess with CT scan, treat with IV morphine and Zofran, hydrate with IV fluids.  Lab work thus far is consistent with patient's chronic kidney disease, although he does have associated  hyperkalemia.  Will add on CK and plan to recheck labs for improvement in elevated potassium.  He does not have any associated EKG changes.  CT scan is negative for acute process, follow-up right upper quadrant ultrasound shows cholelithiasis with positive Murphy sign but no other signs of cholecystitis.  Case discussed with Dr. Lady Gary of general surgery, who agrees with plan for HIDA scan.  Unfortunately, patient potassium continued to rise despite IV fluid administration.  Repeat EKG again does not show any changes and case was discussed with Dr. Cherylann Ratel of nephrology, who recommends administration of Veltassa, bicarb, insulin, and additional IV fluids.  Patient did have a similar presentation in March with hyperkalemia of unclear etiology.  Case was discussed with hospitalist for admission.      ____________________________________________   FINAL CLINICAL IMPRESSION(S) / ED DIAGNOSES  Final diagnoses:  Abdominal pain  Hyperkalemia  Calculus of gallbladder without cholecystitis without obstruction  Intractable vomiting with nausea, unspecified vomiting type     ED Discharge Orders    None       Note:  This document was prepared using Dragon voice recognition software and may include unintentional dictation errors.   Chesley Noon, MD 03/14/20 2019

## 2020-03-14 NOTE — ED Triage Notes (Signed)
EMS Report: pt presents to ED via [facility] from home with c/o epigastric pain that started at approx 0430 this morning. Per EMS pt states intermittent BM that had been infrequent x 1 day, states pain has progressively worsened. VSS en route.

## 2020-03-15 ENCOUNTER — Inpatient Hospital Stay: Payer: BC Managed Care – PPO

## 2020-03-15 ENCOUNTER — Encounter: Payer: Self-pay | Admitting: Internal Medicine

## 2020-03-15 ENCOUNTER — Other Ambulatory Visit: Payer: Self-pay

## 2020-03-15 DIAGNOSIS — R1013 Epigastric pain: Secondary | ICD-10-CM

## 2020-03-15 DIAGNOSIS — R109 Unspecified abdominal pain: Secondary | ICD-10-CM | POA: Diagnosis present

## 2020-03-15 DIAGNOSIS — K8020 Calculus of gallbladder without cholecystitis without obstruction: Secondary | ICD-10-CM

## 2020-03-15 LAB — CBC
HCT: 34.6 % — ABNORMAL LOW (ref 39.0–52.0)
Hemoglobin: 12 g/dL — ABNORMAL LOW (ref 13.0–17.0)
MCH: 26.8 pg (ref 26.0–34.0)
MCHC: 34.7 g/dL (ref 30.0–36.0)
MCV: 77.2 fL — ABNORMAL LOW (ref 80.0–100.0)
Platelets: 174 10*3/uL (ref 150–400)
RBC: 4.48 MIL/uL (ref 4.22–5.81)
RDW: 14.4 % (ref 11.5–15.5)
WBC: 11 10*3/uL — ABNORMAL HIGH (ref 4.0–10.5)
nRBC: 0 % (ref 0.0–0.2)

## 2020-03-15 LAB — BASIC METABOLIC PANEL
Anion gap: 9 (ref 5–15)
BUN: 23 mg/dL — ABNORMAL HIGH (ref 6–20)
CO2: 20 mmol/L — ABNORMAL LOW (ref 22–32)
Calcium: 8.6 mg/dL — ABNORMAL LOW (ref 8.9–10.3)
Chloride: 112 mmol/L — ABNORMAL HIGH (ref 98–111)
Creatinine, Ser: 1.28 mg/dL — ABNORMAL HIGH (ref 0.61–1.24)
GFR calc Af Amer: >60 mL/min (ref >60)
GFR calc non Af Amer: >60 mL/min (ref >60)
Glucose, Bld: 216 mg/dL — ABNORMAL HIGH (ref 70–99)
Potassium: 5.1 mmol/L (ref 3.5–5.1)
Sodium: 141 mmol/L (ref 135–145)

## 2020-03-15 LAB — MAGNESIUM: Magnesium: 1.7 mg/dL (ref 1.7–2.4)

## 2020-03-15 MED ORDER — MORPHINE SULFATE (PF) 2 MG/ML IV SOLN
2.0000 mg | INTRAVENOUS | Status: DC | PRN
  Administered 2020-03-15 – 2020-03-16 (×5): 2 mg via INTRAVENOUS
  Filled 2020-03-15 (×5): qty 1

## 2020-03-15 MED ORDER — PIPERACILLIN-TAZOBACTAM 3.375 G IVPB 30 MIN: 3.3750 g | Freq: Three times a day (TID) | INTRAVENOUS | Status: DC

## 2020-03-15 MED ORDER — PIPERACILLIN-TAZOBACTAM 3.375 G IVPB
3.3750 g | Freq: Three times a day (TID) | INTRAVENOUS | Status: DC
  Administered 2020-03-15 – 2020-03-18 (×8): 3.375 g via INTRAVENOUS
  Filled 2020-03-15 (×10): qty 50

## 2020-03-15 MED ORDER — SODIUM CHLORIDE 0.9 % IV SOLN: INTRAVENOUS | Status: DC

## 2020-03-15 MED ORDER — SODIUM BICARBONATE 650 MG PO TABS
1300.0000 mg | ORAL_TABLET | Freq: Two times a day (BID) | ORAL | Status: DC
  Administered 2020-03-15 – 2020-03-18 (×5): 1300 mg via ORAL
  Filled 2020-03-15 (×7): qty 2

## 2020-03-15 MED ORDER — TECHNETIUM TC 99M MEBROFENIN IV KIT
5.0000 | PACK | Freq: Once | INTRAVENOUS | Status: AC | PRN
  Administered 2020-03-15: 5.19 via INTRAVENOUS

## 2020-03-15 NOTE — ED Notes (Signed)
Pt taken to [facility] on stretcher.

## 2020-03-15 NOTE — ED Notes (Signed)
Pt taken to nuclear med.

## 2020-03-15 NOTE — ED Notes (Signed)
Pt informed of NM later this afternoon and needing to be NPO until then. Informed as well that NM stated no morphine before scan. Pt nodding in understanding.

## 2020-03-15 NOTE — Progress Notes (Signed)
We were originally consulted for evaluation management of acute kidney injury and hyperkalemia.  Serum potassium has now normalized to 5.1.  Recommend administering sodium bicarbonate 1300 mg p.o. twice daily and continue Veltassa 8.4 g p.o. daily.  We will be happy to see him upon discharge from the hospital to follow-up renal dysfunction as well as hyperkalemia.

## 2020-03-15 NOTE — ED Notes (Signed)
Pt returned from [facility], will go back at 4pm for delayed study. Informed by [facility] tech to remain NPO and off narcotic pain medicine until after he goes back to [facility]. Pt aware of restrictions. Resting in bed at this time.

## 2020-03-15 NOTE — Progress Notes (Addendum)
PROGRESS NOTE    Albert Rice  ZSW:[id number] DOB: Sep 29, 1976 DOA: 03/14/2020 PCP: Patient, No Pcp Per    Brief Narrative:  Albert Rice is a 44 y.o. male with medical history significant of diabetes, hypertension, hyperlipidemia, chronic kidney disease stage III, recent hyperkalemia who presented to the ER with abdominal pain nausea vomiting.Nephrology consulted and recommends admission with treatment of his hyperkalemia acutely.  Patient equally had cholelithiasis on abdominal ultrasound which may be responsible for his abdominal pain.  General surgery consulted and recommends HIDA scan and will follow.  Is being admitted to the medical service therefore.. CT abdomen pelvis essentially within normal. Is Covid positive.  However patient states he had a Covid couple of months ago and he had his second shot about a week ago.  He is on contact and airborne precaution.   Consultants:   Nephrology  Procedures  Antimicrobials:      Subjective: Abdominal pain.  No shortness of breath, nausea, vomiting or anything else.  Objective: Vitals:   03/15/20 0345 03/15/20 0445 03/15/20 0700 03/15/20 0730  BP:      Pulse: (!) 114 96  95  Resp: (!) 30  (!) 22   Temp:      TempSrc:      SpO2: 99% 99%  98%  Weight:      Height:        Intake/Output Summary (Last 24 hours) at 03/15/2020 0850 Last data filed at 03/14/2020 2133 Gross per 24 hour  Intake 2000 ml  Output --  Net 2000 ml   Filed Weights   03/14/20 1307  Weight: (!) 138.3 kg    Examination:  General exam: Appears calm and comfortable  Respiratory system: Clear to auscultation. Respiratory effort normal. Cardiovascular system: S1 & S2 heard, RRR. No JVD, murmurs, rubs, gallops or clicks. Gastrointestinal system: Abdomen is nondistended, soft and nontender. Normal bowel sounds heard. Central nervous system: Alert and oriented. No focal neurological deficits. Extremities: no edema Skin: warm and dry  Psychiatry: Judgement  and insight appear normal. Mood & affect appropriate.     Data Reviewed: I have personally reviewed following labs and imaging studies  CBC: Recent Labs  Lab 03/14/20 1321 03/15/20 0354  WBC 8.2 11.0*  HGB 12.6* 12.0*  HCT 37.7* 34.6*  MCV 78.2* 77.2*  PLT 182 322   Basic Metabolic Panel: Recent Labs  Lab 03/14/20 1321 03/14/20 1818 03/15/20 0354  NA 136 138 141  K 5.9* 6.5* 5.1  CL 111 114* 112*  CO2 18* 18* 20*  GLUCOSE 257* 191* 216*  BUN 31* 28* 23*  CREATININE 1.41* 1.31* 1.28*  CALCIUM 8.7* 8.6* 8.6*  MG  --   --  1.7   GFR: Estimated Creatinine Clearance: 105.8 mL/min (A) (by C-G formula based on SCr of 1.28 mg/dL (H)). Liver Function Tests: Recent Labs  Lab 03/14/20 1321  AST 13*  ALT 13  ALKPHOS 42  BILITOT 1.1  PROT 6.7  ALBUMIN 3.7   Recent Labs  Lab 03/14/20 1321  LIPASE 26   No results for input(s): AMMONIA in the last 168 hours. Coagulation Profile: No results for input(s): INR, PROTIME in the last 168 hours. Cardiac Enzymes: Recent Labs  Lab 03/14/20 1321  CKTOTAL 227   BNP (last 3 results) No results for input(s): PROBNP in the last 8760 hours. HbA1C: No results for input(s): HGBA1C in the last 72 hours. CBG: Recent Labs  Lab 03/14/20 2125  GLUCAP 107*   Lipid Profile: No results for input(s):  CHOL, HDL, LDLCALC, TRIG, CHOLHDL, LDLDIRECT in the last 72 hours. Thyroid Function Tests: No results for input(s): TSH, T4TOTAL, FREET4, T3FREE, THYROIDAB in the last 72 hours. Anemia Panel: No results for input(s): VITAMINB12, FOLATE, FERRITIN, TIBC, IRON, RETICCTPCT in the last 72 hours. Sepsis Labs: No results for input(s): PROCALCITON, LATICACIDVEN in the last 168 hours.  Recent Results (from the past 240 hour(s))  SARS Coronavirus 2 by RT PCR (hospital order, performed in [facility] hospital lab) Nasopharyngeal Nasopharyngeal Swab     Status: Abnormal   Collection Time: 03/14/20  8:09 PM   Specimen: Nasopharyngeal Swab    Result Value Ref Range Status   SARS Coronavirus 2 POSITIVE (A) NEGATIVE Final    Comment: RESULT CALLED TO, READ BACK BY AND VERIFIED WITH: ASHLEY MURRAY @2135  03/14/20 AKT (NOTE) SARS-CoV-2 target nucleic acids are DETECTED SARS-CoV-2 RNA is generally detectable in upper respiratory specimens  during the acute phase of infection.  Positive results are indicative  of the presence of the identified virus, but do not rule out bacterial infection or co-infection with other pathogens not detected by the test.  Clinical correlation with patient history and  other diagnostic information is necessary to determine patient infection status.  The expected result is negative. Fact Sheet for Patients:   03/16/20  Fact Sheet for Healthcare Providers:   BoilerBrush.com.cy   This test is not yet approved or cleared by the https://pope.com/ FDA and  has been authorized for detection and/or diagnosis of SARS-CoV-2 by FDA under an Emergency Use Authorization (EUA).  This EUA will remain in effect (meaning this test can be u sed) for the duration of  the COVID-19 declaration under Section 564(b)(1) of the Act, 21 U.S.C. section 360-bbb-3(b)(1), unless the authorization is terminated or revoked sooner. Performed at [facility], [address] [place], [place]          Radiology Studies: CT Abdomen Pelvis W Contrast  Result Date: 03/14/2020 CLINICAL DATA:  Acute abdominal pain.  Non localized. EXAM: CT ABDOMEN AND PELVIS WITH CONTRAST TECHNIQUE: Multidetector CT imaging of the abdomen and pelvis was performed using the standard protocol following bolus administration of intravenous contrast. CONTRAST:  03/16/2020 OMNIPAQUE IOHEXOL 300 MG/ML  SOLN COMPARISON:  06/15/2019 FINDINGS: Lower chest: No acute abnormality. Hepatobiliary: Normal liver. Cholelithiasis. No intrahepatic or extrahepatic biliary ductal dilatation. Pancreas:  Unremarkable. No pancreatic ductal dilatation or surrounding inflammatory changes. Spleen: Normal in size without focal abnormality. Adrenals/Urinary Tract: Normal adrenal glands. Hypodense masses throughout bilateral kidneys with the larger masses measuring fluid attenuation. The largest mass in the upper pole of the right kidney measures 2.9 cm. No obstructive uropathy. Stomach/Bowel: Stomach is within normal limits. Appendix appears normal. No evidence of bowel wall thickening, distention, or inflammatory changes. Vascular/Lymphatic: No significant vascular findings are present. No enlarged abdominal or pelvic lymph nodes. Reproductive: Prostate is unremarkable. Other: No abdominal wall hernia or abnormality. No abdominopelvic ascites. Musculoskeletal: No acute osseous abnormality. No aggressive osseous lesion. Degenerative disease with disc height loss at L4-5 and L5-S1. IMPRESSION: 1. No acute abdominal or pelvic pathology. 2. Cholelithiasis. 3. Normal appendix. 4. Bilateral renal cysts. Electronically Signed   By: 06/17/2019   On: 03/14/2020 16:17   03/16/2020 Abdomen Limited RUQ  Result Date: 03/14/2020 CLINICAL DATA:  Abdominal pain in the right upper quadrant and nausea starting this morning. EXAM: ULTRASOUND ABDOMEN LIMITED RIGHT UPPER QUADRANT COMPARISON:  CT abdomen pelvis 03/14/2020 FINDINGS: Gallbladder: There are multiple gallstones. There is no gallbladder wall  thickening. No pericholecystic fluid. Positive sonographic Murphy sign. Common bile duct: Diameter: 0.5 cm, within normal limits. Liver: No focal lesion identified. Within normal limits in parenchymal echogenicity. Portal vein is patent on color Doppler imaging with normal direction of blood flow towards the liver. Other: None. IMPRESSION: Cholelithiasis and positive sonographic Murphy sign. No gallbladder wall thickening or pericholecystic fluid. Findings are indeterminate for acute cholecystitis. A nuclear medicine HIDA scan could be performed  for further evaluation. Electronically Signed   By: Emmaline Kluver M.D.   On: 03/14/2020 18:17        Scheduled Meds: . patiromer  8.4 g Oral Daily   Continuous Infusions:  Assessment & Plan:   Principal Problem:   Hyperkalemia Active Problems:   Obesity, Class III, BMI 40-49.9 (morbid obesity) (HCC)   Type 2 diabetes mellitus with complication, without long-term current use of insulin (HCC)   Cholelithiasis  #1 hyperkalemia: Causes unknown.  Patient is not on any supplement.  Not on any ARB or ACE inhibitors.  He has had this before and was treated.  Currently treated as above.   Nephrology consulted input was appreciated.  Recommended admission sodium bicarb 1300 mg and continue V[place] 8.4 g p.o. daily Follow-up with nephrology as outpatient K today 5.1   #2 diabetes: Confirm home regimen and resume.  Add sliding scale insulin.  #3 Abdominal pain/nv/cholelithiasis:  possible biliary colic versus early cholecystitis Surgery consulted, rec. HIDA scan pending May Require cholecystectomy prior to discharge.   #4 morbid obesity: Dietary counseling.  #5 hypertension: Continue blood pressure control.  Any further treatment depends on patient's response.  #6 covid +..>pt states had vaccination. Had covid few months ago On airborn and contact precaution  DVT prophylaxis: Lovenox Code Status: Full code Family Communication: No patient at bedside Disposition Plan: Home Barrier: w/u pending, may need surgery. TBD       LOS: 1 day   Time spent: 45 min with >50% on coc    Lynn Ito, MD [facility] Pager [phone]  If 7PM-7AM, please contact night-coverage [email or web address] Password [email or web address] 03/15/2020, 8:50 AM

## 2020-03-15 NOTE — ED Notes (Signed)
Attempted to call receiving floor. Was informed that they do not have the staff to take pt at this time and will need to wait until they DC a pt before this pt can come upstairs.

## 2020-03-15 NOTE — Consult Note (Signed)
Reason for Consult: Abdominal pain, cholelithiasis Referring Physician: Chesley Noon, MD (emergency medicine)  Albert Rice is an 44 y.o. male.  HPI: Albert Rice is a 44 y.o. male with past medical history of hypertension, hyperlipidemia, and diabetes who presented to the ED last night complaining of abdominal pain.  Patient reports that he has had constant pain in his epigastrium since around 4:30 yesterday morning.  He describes it as a sharp and squeezing pain that has been associated with nausea and multiple episodes of vomiting.  He denies any episodes of similar pain in the past and has not had any diarrhea or other changes in his bowel movements.  He has not had any fevers and denies any cough, chest pain, or shortness of breath.  He denies any alcohol or drug consumption, does not take NSAIDs or steroids.  In the emergency department, a CT scan showed cholelithiasis.  Right upper quadrant ultrasound also demonstrated cholelithiasis without evidence of cholecystitis.  He was also quite hyperkalemic and was therefore admitted to the hospital medicine service for management.  General surgery was consulted for symptomatic cholelithiasis, as the patient did not have resolution of his pain with IV medications.  Due to renal insufficiency, Toradol was not given.  HIDA scan is currently pending.  Past Medical History:  Diagnosis Date  . Diabetes mellitus without complication (HCC)   . Hyperlipemia   . Hypertension     Past Surgical History:  Procedure Laterality Date  . pelvis     Surgery for fractured pelvis    History reviewed. No pertinent family history.  Social History:  reports that he has never smoked. He has never used smokeless tobacco. He reports that he does not drink alcohol or use drugs.  Allergies: No Known Allergies  Medications: I have reviewed the patient's current medications.  Results for orders placed or performed during the hospital encounter of 03/14/20 (from the  past 48 hour(s))  Lipase, blood     Status: None   Collection Time: 03/14/20  1:21 PM  Result Value Ref Range   Lipase 26 11 - 51 U/L    Comment: Performed at [facility], [address] [place], [place] [address]  Comprehensive metabolic panel     Status: Abnormal   Collection Time: 03/14/20  1:21 PM  Result Value Ref Range   Sodium 136 135 - 145 mmol/L   Potassium 5.9 (H) 3.5 - 5.1 mmol/L   Chloride 111 98 - 111 mmol/L   CO2 18 (L) 22 - 32 mmol/L   Glucose, Bld 257 (H) 70 - 99 mg/dL    Comment: Glucose reference range applies only to samples taken after fasting for at least 8 hours.   BUN 31 (H) 6 - 20 mg/dL   Creatinine, Ser 0.16 (H) 0.61 - 1.24 mg/dL   Calcium 8.7 (L) 8.9 - 10.3 mg/dL   Total Protein 6.7 6.5 - 8.1 g/dL   Albumin 3.7 3.5 - 5.0 g/dL   AST 13 (L) 15 - 41 U/L   ALT 13 0 - 44 U/L   Alkaline Phosphatase 42 38 - 126 U/L   Total Bilirubin 1.1 0.3 - 1.2 mg/dL   GFR calc non Af Amer >60 >60 mL/min   GFR calc Af Amer >60 >60 mL/min   Anion gap 7 5 - 15    Comment: Performed at [facility], [address], [place], [place] [address]  CBC     Status: Abnormal   Collection Time: 03/14/20  1:21 PM  Result Value Ref Range   WBC 8.2 4.0 - 10.5 K/uL   RBC 4.82 4.22 - 5.81 MIL/uL   Hemoglobin 12.6 (L) 13.0 - 17.0 g/dL   HCT 37.7 (L) 39.0 - 52.0 %   MCV 78.2 (L) 80.0 - 100.0 fL   MCH 26.1 26.0 - 34.0 pg   MCHC 33.4 30.0 - 36.0 g/dL   RDW 14.1 11.5 - 15.5 %   Platelets 182 150 - 400 K/uL   nRBC 0.0 0.0 - 0.2 %    Comment: Performed at [facility], [address] [place], [place] [address]  CK     Status: None   Collection Time: 03/14/20  1:21 PM  Result Value Ref Range   Total CK 227 49 - 397 U/L    Comment: Performed at [facility], [address] [place], [place] [address]  Basic metabolic panel     Status: Abnormal   Collection Time: 03/14/20  6:18 PM  Result Value Ref Range   Sodium 138 135 - 145 mmol/L   Potassium  6.5 (HH) 3.5 - 5.1 mmol/L    Comment: CRITICAL RESULT CALLED TO, READ BACK BY AND VERIFIED WITH ASHLEY MURRAY @1920  03/14/20 MJU    Chloride 114 (H) 98 - 111 mmol/L   CO2 18 (L) 22 - 32 mmol/L   Glucose, Bld 191 (H) 70 - 99 mg/dL    Comment: Glucose reference range applies only to samples taken after fasting for at least 8 hours.   BUN 28 (H) 6 - 20 mg/dL   Creatinine, Ser 1.31 (H) 0.61 - 1.24 mg/dL   Calcium 8.6 (L) 8.9 - 10.3 mg/dL   GFR calc non Af Amer >60 >60 mL/min   GFR calc Af Amer >60 >60 mL/min   Anion gap 6 5 - 15    Comment: Performed at [facility], [address] [place], [place] [address]  Urinalysis, Complete w Microscopic     Status: Abnormal   Collection Time: 03/14/20  8:09 PM  Result Value Ref Range   Color, Urine STRAW (A) YELLOW   APPearance CLEAR (A) CLEAR   Specific Gravity, Urine 1.020 1.005 - 1.030   pH 6.0 5.0 - 8.0   Glucose, UA NEGATIVE NEGATIVE mg/dL   Hgb urine dipstick SMALL (A) NEGATIVE   Bilirubin Urine NEGATIVE NEGATIVE   Ketones, ur NEGATIVE NEGATIVE mg/dL   Protein, ur 100 (A) NEGATIVE mg/dL   Nitrite NEGATIVE NEGATIVE   Leukocytes,Ua NEGATIVE NEGATIVE   RBC / HPF 0-5 0 - 5 RBC/hpf   WBC, UA 0-5 0 - 5 WBC/hpf   Bacteria, UA NONE SEEN NONE SEEN   Squamous Epithelial / LPF NONE SEEN 0 - 5    Comment: Performed at [facility], [address] [place], [place] [address]  SARS Coronavirus 2 by RT PCR (hospital order, performed in [place] hospital lab) Nasopharyngeal Nasopharyngeal Swab     Status: Abnormal   Collection Time: 03/14/20  8:09 PM   Specimen: Nasopharyngeal Swab  Result Value Ref Range   SARS Coronavirus 2 POSITIVE (A) NEGATIVE    Comment: RESULT CALLED TO, READ BACK BY AND VERIFIED WITH: ASHLEY MURRAY @2135  03/14/20 AKT (NOTE) SARS-CoV-2 target nucleic acids are DETECTED SARS-CoV-2 RNA is generally detectable in upper respiratory specimens  during the acute phase of infection.  Positive results are  indicative  of the presence of the identified virus, but do not rule out bacterial infection or co-infection with other  pathogens not detected by the test.  Clinical correlation with patient history and  other diagnostic information is necessary to determine patient infection status.  The expected result is negative. Fact Sheet for Patients:   BoilerBrush.com.cy  Fact Sheet for Healthcare Providers:   https://pope.com/   This test is not yet approved or cleared by the [place] FDA and  has been authorized for detection and/or diagnosis of SARS-CoV-2 by FDA under an Emergency Use Authorization (EUA).  This EUA will remain in effect (meaning this test can be u sed) for the duration of  the COVID-19 declaration under Section 564(b)(1) of the Act, 21 U.S.C. section 360-bbb-3(b)(1), unless the authorization is terminated or revoked sooner. Performed at [facility], [address] [place], [place] [address]   Glucose, capillary     Status: Abnormal   Collection Time: 03/14/20  9:25 PM  Result Value Ref Range   Glucose-Capillary 107 (H) 70 - 99 mg/dL    Comment: Glucose reference range applies only to samples taken after fasting for at least 8 hours.  Basic metabolic panel     Status: Abnormal   Collection Time: 03/15/20  3:54 AM  Result Value Ref Range   Sodium 141 135 - 145 mmol/L   Potassium 5.1 3.5 - 5.1 mmol/L   Chloride 112 (H) 98 - 111 mmol/L   CO2 20 (L) 22 - 32 mmol/L   Glucose, Bld 216 (H) 70 - 99 mg/dL    Comment: Glucose reference range applies only to samples taken after fasting for at least 8 hours.   BUN 23 (H) 6 - 20 mg/dL   Creatinine, Ser 0.81 (H) 0.61 - 1.24 mg/dL   Calcium 8.6 (L) 8.9 - 10.3 mg/dL   GFR calc non Af Amer >60 >60 mL/min   GFR calc Af Amer >60 >60 mL/min   Anion gap 9 5 - 15    Comment: Performed at [facility], [address] [place], [place] [address]  Magnesium      Status: None   Collection Time: 03/15/20  3:54 AM  Result Value Ref Range   Magnesium 1.7 1.7 - 2.4 mg/dL    Comment: Performed at [facility], [address] [place], [place] [address]  CBC     Status: Abnormal   Collection Time: 03/15/20  3:54 AM  Result Value Ref Range   WBC 11.0 (H) 4.0 - 10.5 K/uL   RBC 4.48 4.22 - 5.81 MIL/uL   Hemoglobin 12.0 (L) 13.0 - 17.0 g/dL   HCT 97.0 (L) 26.3 - 78.5 %   MCV 77.2 (L) 80.0 - 100.0 fL   MCH 26.8 26.0 - 34.0 pg   MCHC 34.7 30.0 - 36.0 g/dL   RDW 88.5 02.7 - 74.1 %   Platelets 174 150 - 400 K/uL   nRBC 0.0 0.0 - 0.2 %    Comment: Performed at [facility], [address], [place], [place] [address]    CT Abdomen Pelvis W Contrast  Result Date: 03/14/2020 CLINICAL DATA:  Acute abdominal pain.  Non localized. EXAM: CT ABDOMEN AND PELVIS WITH CONTRAST TECHNIQUE: Multidetector CT imaging of the abdomen and pelvis was performed using the standard protocol following bolus administration of intravenous contrast. CONTRAST:  OMNIPAQUE IOHEXOL 300 MG/ML  SOLN COMPARISON:  06/15/2019 FINDINGS: Lower chest: No acute abnormality. Hepatobiliary: Normal liver. Cholelithiasis. No intrahepatic or extrahepatic biliary ductal dilatation. Pancreas: Unremarkable. No pancreatic ductal dilatation or surrounding inflammatory changes. Spleen: Normal in size without  focal abnormality. Adrenals/Urinary Tract: Normal adrenal glands. Hypodense masses throughout bilateral kidneys with the larger masses measuring fluid attenuation. The largest mass in the upper pole of the right kidney measures 2.9 cm. No obstructive uropathy. Stomach/Bowel: Stomach is within normal limits. Appendix appears normal. No evidence of bowel wall thickening, distention, or inflammatory changes. Vascular/Lymphatic: No significant vascular findings are present. No enlarged abdominal or pelvic lymph nodes. Reproductive: Prostate is unremarkable. Other: No abdominal wall hernia or  abnormality. No abdominopelvic ascites. Musculoskeletal: No acute osseous abnormality. No aggressive osseous lesion. Degenerative disease with disc height loss at L4-5 and L5-S1. IMPRESSION: 1. No acute abdominal or pelvic pathology. 2. Cholelithiasis. 3. Normal appendix. 4. Bilateral renal cysts. Electronically Signed   By: Elige KoHetal  Patel   On: 03/14/2020 16:17   US Abdomen Limited RUQ  Result Date: 03/14/2020 CLINICAL DATA:  Abdominal pain in the right upper quadrant and nausea starting this morning. EXAM: ULTRASOUND ABDOMEN LIMITED RIGHT UPPER QUADRANT COMPARISON:  CT abdomen pelvis 03/14/2020 FINDINGS: Gallbladder: There are multiple gallstones. There is no gallbladder wall thickening. No pericholecystic fluid. Positive sonographic Murphy sign. Common bile duct: Diameter: 0.5 cm, within normal limits. Liver: No focal lesion identified. Within normal limits in parenchymal echogenicity. Portal vein is patent on color Doppler imaging with normal direction of blood flow towards the liver. Other: None. IMPRESSION: Cholelithiasis and positive sonographic Murphy sign. No gallbladder wall thickening or pericholecystic fluid. Findings are indeterminate for acute cholecystitis. A nuclear medicine HIDA scan could be performed for further evaluation. Electronically Signed   By: Emmaline KluverNancy  Ballantyne M.D.   On: 03/14/2020 18:17    Review of Systems  Gastrointestinal: Positive for abdominal pain, nausea and vomiting.   Blood pressure 138/89, pulse 92, temperature 98.5 F (36.9 C), temperature source Oral, resp. rate 20, height 5\' 11"  (1.803 m), weight (!) 138.3 kg, SpO2 99 %. Body mass index is 42.54 kg/m.  Physical Exam  Constitutional: He is oriented to person, place, and time.  He is morbidly obese.  He appears uncomfortable.  HENT:  Head: Normocephalic.  Eyes: Right eye exhibits no discharge. Left eye exhibits no discharge. No scleral icterus.  Neck: No tracheal deviation present.  Cardiovascular: Regular  rhythm.  Respiratory: Effort normal. No stridor. No respiratory distress.  GI: Soft. There is abdominal tenderness.  There is tenderness to palpation in the midepigastrium and right upper quadrant.  No peritoneal signs.  Murphy sign is equivocal.  Genitourinary:    Genitourinary Comments: Deferred   Musculoskeletal:        General: No deformity.  Neurological: He is alert and oriented to person, place, and time.  Skin: Skin is warm and dry.  Psychiatric: He has a normal mood and affect.    Assessment/Plan: This is a 44 year old man with possible biliary colic versus early cholecystitis.  Imaging has not been particularly helpful and therefore we are waiting for a nuclear medicine scan.  His hyperkalemia is being treated medically.  He may require cholecystectomy prior to discharge.  I did discuss the risks of the operation with the patient and his wife, who is at bedside I discussed the procedure in detail.  We discussed the risks and benefits of a laparoscopic cholecystectomy and possible cholangiogram including, but not limited to: bleeding, infection, injury to surrounding structures such as the intestine or liver, bile leak, retained gallstones, need to convert to an open procedure, prolonged diarrhea, blood clots such as DVT, common bile duct injury, anesthesia risks, and possible need for additional procedures. The  patient had the opportunity to ask any questions and these were answered to his satisfaction.      Duanne Guess 03/15/2020, 1:20 PM

## 2020-03-16 ENCOUNTER — Encounter
Admission: EM | Disposition: A | Discharge status: Home / Self Care | Payer: Self-pay | Source: Home / Self Care | Attending: Internal Medicine

## 2020-03-16 ENCOUNTER — Inpatient Hospital Stay: Payer: BC Managed Care – PPO | Admitting: Anesthesiology

## 2020-03-16 ENCOUNTER — Encounter: Payer: Self-pay | Admitting: Internal Medicine

## 2020-03-16 DIAGNOSIS — K810 Acute cholecystitis: Secondary | ICD-10-CM

## 2020-03-16 DIAGNOSIS — E118 Type 2 diabetes mellitus with unspecified complications: Secondary | ICD-10-CM

## 2020-03-16 HISTORY — PX: CHOLECYSTECTOMY: SHX55

## 2020-03-16 LAB — PHOSPHORUS: Phosphorus: 2.8 mg/dL (ref 2.5–4.6)

## 2020-03-16 LAB — COMPREHENSIVE METABOLIC PANEL
ALT: 12 U/L (ref 0–44)
AST: 15 U/L (ref 15–41)
Albumin: 3.2 g/dL — ABNORMAL LOW (ref 3.5–5.0)
Alkaline Phosphatase: 49 U/L (ref 38–126)
Anion gap: 8 (ref 5–15)
BUN: 19 mg/dL (ref 6–20)
CO2: 21 mmol/L — ABNORMAL LOW (ref 22–32)
Calcium: 8.7 mg/dL — ABNORMAL LOW (ref 8.9–10.3)
Chloride: 111 mmol/L (ref 98–111)
Creatinine, Ser: 1.44 mg/dL — ABNORMAL HIGH (ref 0.61–1.24)
GFR calc Af Amer: >60 mL/min (ref >60)
GFR calc non Af Amer: 59 mL/min — ABNORMAL LOW (ref >60)
Glucose, Bld: 187 mg/dL — ABNORMAL HIGH (ref 70–99)
Potassium: 4.6 mmol/L (ref 3.5–5.1)
Sodium: 140 mmol/L (ref 135–145)
Total Bilirubin: 1.8 mg/dL — ABNORMAL HIGH (ref 0.3–1.2)
Total Protein: 6.7 g/dL (ref 6.5–8.1)

## 2020-03-16 LAB — GLUCOSE, CAPILLARY
Glucose-Capillary: 164 mg/dL — ABNORMAL HIGH (ref 70–99)
Glucose-Capillary: 251 mg/dL — ABNORMAL HIGH (ref 70–99)

## 2020-03-16 LAB — CBC
HCT: 36.7 % — ABNORMAL LOW (ref 39.0–52.0)
Hemoglobin: 12.5 g/dL — ABNORMAL LOW (ref 13.0–17.0)
MCH: 26.3 pg (ref 26.0–34.0)
MCHC: 34.1 g/dL (ref 30.0–36.0)
MCV: 77.3 fL — ABNORMAL LOW (ref 80.0–100.0)
Platelets: 188 10*3/uL (ref 150–400)
RBC: 4.75 MIL/uL (ref 4.22–5.81)
RDW: 14.6 % (ref 11.5–15.5)
WBC: 13.4 10*3/uL — ABNORMAL HIGH (ref 4.0–10.5)
nRBC: 0 % (ref 0.0–0.2)

## 2020-03-16 LAB — SURGICAL PCR SCREEN
MRSA, PCR: NEGATIVE
Staphylococcus aureus: NEGATIVE

## 2020-03-16 LAB — MAGNESIUM: Magnesium: 1.8 mg/dL (ref 1.7–2.4)

## 2020-03-16 SURGERY — LAPAROSCOPIC CHOLECYSTECTOMY
Anesthesia: General

## 2020-03-16 MED ORDER — DEXAMETHASONE SODIUM PHOSPHATE 10 MG/ML IJ SOLN
INTRAMUSCULAR | Status: DC | PRN
  Administered 2020-03-16: 5 mg via INTRAVENOUS

## 2020-03-16 MED ORDER — INSULIN ASPART 100 UNIT/ML ~~LOC~~ SOLN
SUBCUTANEOUS | Status: AC
  Filled 2020-03-16: qty 1

## 2020-03-16 MED ORDER — SIMETHICONE 80 MG PO CHEW
40.0000 mg | CHEWABLE_TABLET | Freq: Four times a day (QID) | ORAL | Status: DC | PRN
  Filled 2020-03-16: qty 1

## 2020-03-16 MED ORDER — MIDAZOLAM HCL 2 MG/2ML IJ SOLN
INTRAMUSCULAR | Status: AC
  Filled 2020-03-16: qty 2

## 2020-03-16 MED ORDER — ROCURONIUM BROMIDE 100 MG/10ML IV SOLN
INTRAVENOUS | Status: DC | PRN
  Administered 2020-03-16 (×2): 20 mg via INTRAVENOUS
  Administered 2020-03-16: 40 mg via INTRAVENOUS
  Administered 2020-03-16: 10 mg via INTRAVENOUS

## 2020-03-16 MED ORDER — ACETAMINOPHEN 10 MG/ML IV SOLN
INTRAVENOUS | Status: DC | PRN
  Administered 2020-03-16: 1000 mg via INTRAVENOUS

## 2020-03-16 MED ORDER — INSULIN ASPART 100 UNIT/ML ~~LOC~~ SOLN
4.0000 [IU] | Freq: Once | SUBCUTANEOUS | Status: AC
  Administered 2020-03-16: 4 [IU] via SUBCUTANEOUS

## 2020-03-16 MED ORDER — MUPIROCIN 2 % EX OINT
1.0000 "application " | TOPICAL_OINTMENT | Freq: Two times a day (BID) | CUTANEOUS | Status: DC
  Filled 2020-03-16: qty 22

## 2020-03-16 MED ORDER — PROPOFOL 10 MG/ML IV BOLUS
INTRAVENOUS | Status: AC
  Filled 2020-03-16: qty 20

## 2020-03-16 MED ORDER — DEXAMETHASONE SODIUM PHOSPHATE 10 MG/ML IJ SOLN
INTRAMUSCULAR | Status: AC
  Filled 2020-03-16: qty 1

## 2020-03-16 MED ORDER — SUCCINYLCHOLINE CHLORIDE 20 MG/ML IJ SOLN
INTRAMUSCULAR | Status: DC | PRN
  Administered 2020-03-16: 120 mg via INTRAVENOUS

## 2020-03-16 MED ORDER — FENTANYL CITRATE (PF) 100 MCG/2ML IJ SOLN
INTRAMUSCULAR | Status: AC
  Administered 2020-03-16: 25 ug via INTRAVENOUS
  Filled 2020-03-16: qty 2

## 2020-03-16 MED ORDER — ONDANSETRON HCL 4 MG/2ML IJ SOLN
4.0000 mg | Freq: Four times a day (QID) | INTRAMUSCULAR | Status: DC | PRN
  Administered 2020-03-16: 4 mg via INTRAVENOUS
  Filled 2020-03-16: qty 2

## 2020-03-16 MED ORDER — FENTANYL CITRATE (PF) 100 MCG/2ML IJ SOLN
INTRAMUSCULAR | Status: DC | PRN
  Administered 2020-03-16: 100 ug via INTRAVENOUS
  Administered 2020-03-16 (×2): 50 ug via INTRAVENOUS

## 2020-03-16 MED ORDER — SUGAMMADEX SODIUM 500 MG/5ML IV SOLN
INTRAVENOUS | Status: DC | PRN
  Administered 2020-03-16: 300 mg via INTRAVENOUS

## 2020-03-16 MED ORDER — GABAPENTIN 300 MG PO CAPS
300.0000 mg | ORAL_CAPSULE | Freq: Two times a day (BID) | ORAL | Status: DC
  Administered 2020-03-16 – 2020-03-18 (×5): 300 mg via ORAL
  Filled 2020-03-16 (×5): qty 1

## 2020-03-16 MED ORDER — ONDANSETRON HCL 4 MG/2ML IJ SOLN
INTRAMUSCULAR | Status: DC | PRN
  Administered 2020-03-16: 4 mg via INTRAVENOUS

## 2020-03-16 MED ORDER — POLYETHYLENE GLYCOL 3350 17 G PO PACK: 17.0000 g | PACK | Freq: Every day | ORAL | Status: DC | PRN

## 2020-03-16 MED ORDER — OXYCODONE HCL 5 MG/5ML PO SOLN: 5.0000 mg | Freq: Once | ORAL | Status: DC | PRN

## 2020-03-16 MED ORDER — PHENYLEPHRINE HCL (PRESSORS) 10 MG/ML IV SOLN
INTRAVENOUS | Status: DC | PRN
  Administered 2020-03-16 (×2): 100 ug via INTRAVENOUS

## 2020-03-16 MED ORDER — LIDOCAINE HCL (CARDIAC) PF 100 MG/5ML IV SOSY
PREFILLED_SYRINGE | INTRAVENOUS | Status: DC | PRN
  Administered 2020-03-16: 40 mg via INTRAVENOUS
  Administered 2020-03-16: 60 mg via INTRAVENOUS

## 2020-03-16 MED ORDER — FENTANYL CITRATE (PF) 100 MCG/2ML IJ SOLN
INTRAMUSCULAR | Status: AC
  Filled 2020-03-16: qty 2

## 2020-03-16 MED ORDER — FAMOTIDINE 20 MG PO TABS
20.0000 mg | ORAL_TABLET | Freq: Once | ORAL | Status: AC
  Administered 2020-03-16: 20 mg via ORAL

## 2020-03-16 MED ORDER — BUPIVACAINE HCL (PF) 0.25 % IJ SOLN
INTRAMUSCULAR | Status: AC
  Filled 2020-03-16: qty 30

## 2020-03-16 MED ORDER — OXYCODONE HCL 5 MG PO TABS: 5.0000 mg | ORAL_TABLET | Freq: Once | ORAL | Status: DC | PRN

## 2020-03-16 MED ORDER — FAMOTIDINE 20 MG PO TABS
ORAL_TABLET | ORAL | Status: AC
  Filled 2020-03-16: qty 1

## 2020-03-16 MED ORDER — PROPOFOL 10 MG/ML IV BOLUS
INTRAVENOUS | Status: DC | PRN
  Administered 2020-03-16: 200 mg via INTRAVENOUS

## 2020-03-16 MED ORDER — SUGAMMADEX SODIUM 500 MG/5ML IV SOLN
INTRAVENOUS | Status: AC
  Filled 2020-03-16: qty 5

## 2020-03-16 MED ORDER — ACETAMINOPHEN 500 MG PO TABS
1000.0000 mg | ORAL_TABLET | Freq: Four times a day (QID) | ORAL | Status: DC
  Administered 2020-03-16 – 2020-03-18 (×5): 1000 mg via ORAL
  Filled 2020-03-16 (×5): qty 2

## 2020-03-16 MED ORDER — VISTASEAL 10 ML SINGLE DOSE KIT
PACK | CUTANEOUS | Status: AC
  Filled 2020-03-16: qty 10

## 2020-03-16 MED ORDER — ONDANSETRON HCL 4 MG/2ML IJ SOLN
INTRAMUSCULAR | Status: AC
  Filled 2020-03-16: qty 2

## 2020-03-16 MED ORDER — ONDANSETRON HCL 4 MG/2ML IJ SOLN: 4.0000 mg | Freq: Once | INTRAMUSCULAR | Status: DC | PRN

## 2020-03-16 MED ORDER — LIDOCAINE-EPINEPHRINE 1 %-1:100000 IJ SOLN
INTRAMUSCULAR | Status: DC | PRN
  Administered 2020-03-16: 25 mL

## 2020-03-16 MED ORDER — FENTANYL CITRATE (PF) 100 MCG/2ML IJ SOLN
25.0000 ug | INTRAMUSCULAR | Status: DC | PRN
  Administered 2020-03-16 (×3): 25 ug via INTRAVENOUS

## 2020-03-16 MED ORDER — VASOPRESSIN 20 UNIT/ML IV SOLN
INTRAVENOUS | Status: DC | PRN
Start: 2020-03-16 — End: 2020-03-16
  Administered 2020-03-16: 3 [IU] via INTRAVENOUS
  Administered 2020-03-16: 2 [IU] via INTRAVENOUS
  Administered 2020-03-16: 3 [IU] via INTRAVENOUS
  Administered 2020-03-16 (×3): 2 [IU] via INTRAVENOUS

## 2020-03-16 MED ORDER — CELECOXIB 200 MG PO CAPS
200.0000 mg | ORAL_CAPSULE | Freq: Two times a day (BID) | ORAL | Status: DC
  Administered 2020-03-16 – 2020-03-17 (×2): 200 mg via ORAL
  Filled 2020-03-16 (×3): qty 1

## 2020-03-16 MED ORDER — TRAMADOL HCL 50 MG PO TABS
50.0000 mg | ORAL_TABLET | Freq: Four times a day (QID) | ORAL | Status: DC | PRN
  Administered 2020-03-17: 21:00:00 50 mg via ORAL
  Filled 2020-03-16: qty 1

## 2020-03-16 MED ORDER — MIDAZOLAM HCL 2 MG/2ML IJ SOLN
INTRAMUSCULAR | Status: DC | PRN
  Administered 2020-03-16 (×2): 1 mg via INTRAVENOUS

## 2020-03-16 MED ORDER — OXYCODONE HCL 5 MG PO TABS
5.0000 mg | ORAL_TABLET | ORAL | Status: DC | PRN
  Administered 2020-03-17: 5 mg via ORAL
  Filled 2020-03-16: qty 1

## 2020-03-16 MED ORDER — METHOCARBAMOL 500 MG PO TABS
500.0000 mg | ORAL_TABLET | Freq: Four times a day (QID) | ORAL | Status: DC | PRN
  Filled 2020-03-16: qty 1

## 2020-03-16 MED ORDER — ONDANSETRON 4 MG PO TBDP
4.0000 mg | ORAL_TABLET | Freq: Three times a day (TID) | ORAL | Status: DC | PRN
  Filled 2020-03-16: qty 1

## 2020-03-16 MED ORDER — LIDOCAINE-EPINEPHRINE 1 %-1:100000 IJ SOLN
INTRAMUSCULAR | Status: AC
  Filled 2020-03-16: qty 1

## 2020-03-16 MED ORDER — VASOPRESSIN 20 UNIT/ML IV SOLN
INTRAVENOUS | Status: AC
  Filled 2020-03-16: qty 1

## 2020-03-16 MED ORDER — VISTASEAL 10 ML SINGLE DOSE KIT
PACK | CUTANEOUS | Status: DC | PRN
  Administered 2020-03-16: 10 mL via TOPICAL

## 2020-03-16 MED ORDER — HEMOSTATIC AGENTS (NO CHARGE) OPTIME
TOPICAL | Status: DC | PRN
  Administered 2020-03-16: 1 via TOPICAL

## 2020-03-16 MED ORDER — ACETAMINOPHEN 10 MG/ML IV SOLN
INTRAVENOUS | Status: AC
  Filled 2020-03-16: qty 100

## 2020-03-16 MED ORDER — AMLODIPINE BESYLATE 10 MG PO TABS
10.0000 mg | ORAL_TABLET | Freq: Every day | ORAL | Status: DC
  Administered 2020-03-16 – 2020-03-18 (×3): 10 mg via ORAL
  Filled 2020-03-16 (×3): qty 1

## 2020-03-16 SURGICAL SUPPLY — 55 items
APPLICATOR ARISTA FLEXITIP XL (MISCELLANEOUS) ×1 IMPLANT
APPLICATOR VISTASEAL 35 (MISCELLANEOUS) ×1 IMPLANT
APPLIER CLIP 5 13 M/L LIGAMAX5 (MISCELLANEOUS) ×2
BLADE SURG SZ11 CARB STEEL (BLADE) ×2 IMPLANT
CANISTER SUCT 1200ML W/VALVE (MISCELLANEOUS) ×2 IMPLANT
CATH CHOLANGI 4FR 420404F (CATHETERS) IMPLANT
CHLORAPREP W/TINT 26 (MISCELLANEOUS) ×2 IMPLANT
CLIP APPLIE 5 13 M/L LIGAMAX5 (MISCELLANEOUS) ×1 IMPLANT
COVER WAND RF STERILE (DRAPES) ×1 IMPLANT
DECANTER SPIKE VIAL GLASS SM (MISCELLANEOUS) ×4 IMPLANT
DEFOGGER SCOPE WARMER CLEARIFY (MISCELLANEOUS) ×2 IMPLANT
DERMABOND ADVANCED (GAUZE/BANDAGES/DRESSINGS) ×1
DERMABOND ADVANCED .7 DNX12 (GAUZE/BANDAGES/DRESSINGS) ×1 IMPLANT
DRAIN CHANNEL JP 19F (MISCELLANEOUS) ×1 IMPLANT
ELECT CAUTERY BLADE TIP 2.5 (TIP) ×2
ELECT REM PT RETURN 9FT ADLT (ELECTROSURGICAL) ×2
ELECTRODE CAUTERY BLDE TIP 2.5 (TIP) ×1 IMPLANT
ELECTRODE REM PT RTRN 9FT ADLT (ELECTROSURGICAL) ×1 IMPLANT
GLOVE BIO SURGEON STRL SZ 6.5 (GLOVE) ×5 IMPLANT
GLOVE INDICATOR 7.0 STRL GRN (GLOVE) ×5 IMPLANT
GOWN STRL REUS W/ TWL LRG LVL3 (GOWN DISPOSABLE) ×3 IMPLANT
GOWN STRL REUS W/TWL LRG LVL3 (GOWN DISPOSABLE) ×4
GRASPER SUT TROCAR 14GX15 (MISCELLANEOUS) IMPLANT
HEMOSTAT ARISTA ABSORB 3G PWDR (HEMOSTASIS) ×1 IMPLANT
HEMOSTAT SURGICEL 2X3 (HEMOSTASIS) ×1 IMPLANT
IRRIGATION STRYKERFLOW (MISCELLANEOUS) ×1 IMPLANT
IRRIGATOR STRYKERFLOW (MISCELLANEOUS) ×2
IV CATH ANGIO 12GX3 LT BLUE (NEEDLE) IMPLANT
IV NS 1000ML (IV SOLUTION) ×1
IV NS 1000ML BAXH (IV SOLUTION) ×1 IMPLANT
KIT TURNOVER KIT A (KITS) ×2 IMPLANT
KITTNER LAPARASCOPIC 5X40 (MISCELLANEOUS) IMPLANT
LABEL OR SOLS (LABEL) ×2 IMPLANT
NEEDLE HYPO 22GX1.5 SAFETY (NEEDLE) ×2 IMPLANT
NS IRRIG 500ML POUR BTL (IV SOLUTION) ×2 IMPLANT
PACK LAP CHOLECYSTECTOMY (MISCELLANEOUS) ×2 IMPLANT
PENCIL ELECTRO HAND CTR (MISCELLANEOUS) ×2 IMPLANT
POUCH SPECIMEN RETRIEVAL 10MM (ENDOMECHANICALS) ×2 IMPLANT
SCISSORS METZENBAUM CVD 33 (INSTRUMENTS) ×2 IMPLANT
SET TUBE SMOKE EVAC HIGH FLOW (TUBING) ×2 IMPLANT
SLEEVE ADV FIXATION 5X100MM (TROCAR) ×4 IMPLANT
SOLUTION ELECTROLUBE (MISCELLANEOUS) ×2 IMPLANT
STRIP CLOSURE SKIN 1/2X4 (GAUZE/BANDAGES/DRESSINGS) ×2 IMPLANT
SUT ETHILON 3-0 FS-10 30 BLK (SUTURE) ×2
SUT MNCRL 4-0 (SUTURE) ×1
SUT MNCRL 4-0 27XMFL (SUTURE) ×1
SUT VIC AB 3-0 SH 27 (SUTURE) ×1
SUT VIC AB 3-0 SH 27X BRD (SUTURE) ×1 IMPLANT
SUT VICRYL 0 AB UR-6 (SUTURE) ×4 IMPLANT
SUTURE EHLN 3-0 FS-10 30 BLK (SUTURE) IMPLANT
SUTURE MNCRL 4-0 27XMF (SUTURE) ×1 IMPLANT
SYS KII FIOS ACCESS ABD 5X100 (TROCAR) ×2
SYSTEM KII FIOS ACES ABD 5X100 (TROCAR) ×1 IMPLANT
TROCAR ADV FIXATION 12X100MM (TROCAR) ×2 IMPLANT
WATER STERILE IRR 1000ML POUR (IV SOLUTION) ×2 IMPLANT

## 2020-03-16 NOTE — Transfer of Care (Signed)
Immediate Anesthesia Transfer of Care Note  Patient: Albert Rice  Procedure(s) Performed: LAPAROSCOPIC CHOLECYSTECTOMY (N/A )  Patient Location: PACU  Anesthesia Type:General  Level of Consciousness: drowsy and patient cooperative  Airway & Oxygen Therapy: Patient Spontanous Breathing  Post-op Assessment: Report given to RN and Post -op Vital signs reviewed and stable  Post vital signs: Reviewed and stable  Last Vitals:  Vitals Value Taken Time  BP 119/66 03/16/20 1159  Temp 36.6 C 03/16/20 1159  Pulse 85 03/16/20 1202  Resp 19 03/16/20 1202  SpO2 100 % 03/16/20 1202  Vitals shown include unvalidated device data.  Last Pain:  Vitals:   03/16/20 0754  TempSrc: Temporal  PainSc: 5          Complications: No apparent anesthesia complications

## 2020-03-16 NOTE — Anesthesia Procedure Notes (Signed)
Procedure Name: Intubation Date/Time: 03/16/2020 8:47 AM Performed by: Omer Jack, CRNA Pre-anesthesia Checklist: Patient identified, Patient being monitored, Timeout performed, Emergency Drugs available and Suction available Patient Re-evaluated:Patient Re-evaluated prior to induction Oxygen Delivery Method: Circle system utilized Preoxygenation: Pre-oxygenation with 100% oxygen Induction Type: IV induction Ventilation: Mask ventilation without difficulty Laryngoscope Size: McGraph and 4 Grade View: Grade II Tube type: Oral Tube size: 7.5 mm Number of attempts: 1 Airway Equipment and Method: Stylet Placement Confirmation: ETT inserted through vocal cords under direct vision,  positive ETCO2 and breath sounds checked- equal and bilateral Secured at: 22 cm Tube secured with: Tape Dental Injury: Teeth and Oropharynx as per pre-operative assessment

## 2020-03-16 NOTE — Anesthesia Preprocedure Evaluation (Addendum)
Anesthesia Evaluation  Patient identified by MRN, date of birth, ID band Patient awake    Reviewed: Allergy & Precautions, H&P , NPO status , Patient's Chart, lab work & pertinent test results  Airway Mallampati: III  TM Distance: >3 FB Neck ROM: full    Dental  (+) Missing   Pulmonary neg pulmonary ROS,    Pulmonary exam normal        Cardiovascular hypertension, Normal cardiovascular exam     Neuro/Psych negative neurological ROS  negative psych ROS   GI/Hepatic negative GI ROS, Neg liver ROS,   Endo/Other  diabetes  Renal/GU      Musculoskeletal   Abdominal   Peds  Hematology negative hematology ROS (+)   Anesthesia Other Findings Past Medical History: No date: Diabetes mellitus without complication (HCC) No date: Hyperlipemia No date: Hypertension  Past Surgical History: No date: pelvis     Comment:  Surgery for fractured pelvis  BMI    Body Mass Index: 42.54 kg/m      Reproductive/Obstetrics negative OB ROS                            Anesthesia Physical Anesthesia Plan  ASA: III  Anesthesia Plan: General ETT   Post-op Pain Management:    Induction:   PONV Risk Score and Plan: Ondansetron, Dexamethasone, Midazolam and Treatment may vary due to age or medical condition  Airway Management Planned:   Additional Equipment:   Intra-op Plan:   Post-operative Plan:   Informed Consent: I have reviewed the patients History and Physical, chart, labs and discussed the procedure including the risks, benefits and alternatives for the proposed anesthesia with the patient or authorized representative who has indicated his/her understanding and acceptance.     Dental Advisory Given  Plan Discussed with: Anesthesiologist, CRNA and Surgeon  Anesthesia Plan Comments:         Anesthesia Quick Evaluation

## 2020-03-16 NOTE — Progress Notes (Signed)
Initial Nutrition Assessment  DOCUMENTATION CODES:   Morbid obesity  INTERVENTION:  Once diet is advanced provide Ensure Max Protein po BID, each supplement provides 150 kcal and 30 grams of protein.  Discussed eating small, frequent meals throughout the day once diet is able to be advanced in setting of decreased appetite.  NUTRITION DIAGNOSIS:   Inadequate oral intake related to decreased appetite as evidenced by per patient/family report.  GOAL:   Patient will meet greater than or equal to 90% of their needs  MONITOR:   PO intake, Diet advancement, Supplement acceptance, Labs, Weight trends, I & O's, Skin  REASON FOR ASSESSMENT:   Malnutrition Screening Tool    ASSESSMENT:   44 year old male with PMHx of DM, HTN, HLD, hx COVID 12/2019 admitted with acute cholecystitis s/p laparoscopic cholecystectomy on 6/2.   Met with patient and his wife at bedside. Patient had returned from surgery earlier today and was tired. He reports that he had a decreased appetite for about one month PTA. He would eat one meal in the morning (breakfast sandwich or eggs) then would not have any other meals during the day. He would only sip on water. He used to eat 3 meals daily and eat more meat such as chicken or fish. He does not eat any pork. Patient reports that his appetite was also decreased in March when he had COVID but then had improved. Patient is amenable to drinking Ensure Max Protein once diet is advanced.  Patient's UBW was 335 lbs and he has lost approximately 30 lbs. That would be a weight loss of 9% body weight over unknown time period. According to weight history in chart patient was 137.4 kg on 06/15/2019, 131.5 kg on 12/21/2019, and is now 138.3 kg (305 lbs).  Medications reviewed and include: sodium bicarbonate 1300 mg BID, NS at 125 mL/hr, Zosyn.  Labs reviewed: CBG 164-251, CO2 21, Creatinine 1.44.  Patient does not meet criteria for malnutrition at this time but is at risk for  malnutrition.  Discussed with RN.  NUTRITION - FOCUSED PHYSICAL EXAM:    Most Recent Value  Orbital Region  No depletion  Upper Arm Region  No depletion  Thoracic and Lumbar Region  No depletion  Buccal Region  No depletion  Temple Region  No depletion  Clavicle Bone Region  No depletion  Clavicle and Acromion Bone Region  No depletion  Scapular Bone Region  Unable to assess  Dorsal Hand  No depletion  Patellar Region  No depletion  Anterior Thigh Region  No depletion  Posterior Calf Region  No depletion  Edema (RD Assessment)  None  Hair  Reviewed  Eyes  Reviewed  Mouth  Reviewed  Skin  Reviewed  Nails  Reviewed     Diet Order:   Diet Order            Diet clear liquid Room service appropriate? Yes; Fluid consistency: Thin  Diet effective now             EDUCATION NEEDS:   No education needs have been identified at this time  Skin:  Skin Assessment: Skin Integrity Issues:(closed incision to abdomen)  Last BM:  03/14/2020 per chart  Height:   Ht Readings from Last 1 Encounters:  03/14/20 '5\' 11"'$  (1.803 m)   Weight:   Wt Readings from Last 1 Encounters:  03/14/20 (!) 138.3 kg   BMI:  Body mass index is 42.54 kg/m.  Estimated Nutritional Needs:   Kcal:  2500-2700  Protein:  >138 grams  Fluid:  >/= 2.3 L/day  Jacklynn Barnacle, MS, RD, LDN Pager number available on Amion

## 2020-03-16 NOTE — Op Note (Signed)
Laparoscopic Cholecystectomy  Pre-operative Diagnosis: Acute cholecystitis  Post-operative Diagnosis: Same  Procedure: Laparoscopic cholecystectomy  Surgeon: Duanne Guess, MD  Anesthesia: GETA  Assistant: None   Findings: Extensive inflammation with highly vascularized adhesions consistent with severe acute cholecystitis.  Estimated Blood Loss: 100 cc         Drains: 60 French Blake drain in the right upper quadrant.         Specimens: Gallbladder           Complications: none   Procedure Details  The patient was seen again in the preoperative holding area. The benefits, complications, treatment options, and expected outcomes were discussed with the patient. The risks of bleeding, infection, recurrence of symptoms, failure to resolve symptoms, bile duct damage, bile duct leak, retained common bile duct stone, bowel injury, any of which could require further surgery and/or ERCP, stent, or papillotomy were reviewed with the patient. The likelihood of improving the patient's symptoms with return to their baseline status is good.  The patient and/or family concurred with the proposed plan, giving informed consent.  The patient was taken to operating room, identified as Albert Rice and the procedure verified as Laparoscopic Cholecystectomy. A time out was performed and the above information confirmed.  The patient has been receiving antibiotics on the floor, therefore antibiotic prophylaxis was not administered. VTE prophylaxis was in place. General endotracheal anesthesia was then administered and tolerated well. After the induction, the abdomen was prepped with Chloraprep and draped in the sterile fashion. The patient was positioned in the supine position.  Optiview technique was used to enter the abdominal cavity in the right upper quadrant in the midclavicular line.  Pneumoperitoneum was then created with CO2 and tolerated well without any adverse changes in the patient's vital  signs.  A 5 mm port was placed in a supraumbilical location and 2 additional 5-mm ports were placed in the right upper quadrant, all under direct vision. All skin incisions  were infiltrated with a local anesthetic agent before making the incision and placing the trocars.   The patient was positioned  in reverse Trendelenburg, tilted slightly to the patient's left.  The patient's liver was quite enlarged and had stigmata of fatty liver disease.  The gallbladder was identified.  It was visibly profoundly inflamed and tense.  We were unable to grasp it for retraction until approximately 15 cc of murky bile was aspirated from the fundus.  The gallbladder was also noted to be very full of stones.  The fundus was then grasped and retracted cephalad. Adhesions were lysed bluntly as well as with electrocautery. The infundibulum was grasped and retracted laterally, exposing the peritoneum overlying the triangle of Calot. This was then divided and exposed in a blunt fashion. An extended critical view of the cystic duct and cystic artery was obtained.  The cystic duct was clearly identified and bluntly dissected free. Both the cystic artery and duct were double clipped and divided.  The gallbladder was taken from the gallbladder fossa in a retrograde fashion with the electrocautery.  During this process, several fractures in the liver bed occurred, causing moderate oozing.  Once the gallbladder was freed from the fossa, it gallbladder was placed in an Endocatch bag. The liver bed was irrigated and inspected. Hemostasis was achieved with the electrocautery, along with multiple topical hemostatic agents, including Arista, Surgicel, and Vistaseal.  The gallbladder and Endocatch sac were then removed through a port site.,  Which had to be enlarged significantly in order to accommodate  the very large and full gallbladder.  A #19 round Blake drain was introduced and placed in the right upper quadrant.  The tubing was brought  out through the right lateralmost port site.  Inspection of the right upper quadrant was performed. No ongoing bleeding, bile duct injury or leak, or bowel injury was noted. Pneumoperitoneum was released.  The periumbilical port site was closed with interrumpted 0 Vicryl sutures. 4-0 subcuticular Monocryl was used to close the skin.  The drain was secured with nylon.  Dermabond was applied, followed by Steri-Strips.  A drain sponge was placed around the exit site.  The patient was then extubated and brought to the recovery room in stable condition. Sponge, lap, and needle counts were reported to be correct at closure and at the conclusion of the case.               Fredirick Maudlin, MD, FACS

## 2020-03-16 NOTE — Anesthesia Postprocedure Evaluation (Signed)
Anesthesia Post Note  Patient: Albert Rice  Procedure(s) Performed: LAPAROSCOPIC CHOLECYSTECTOMY (N/A )  Patient location during evaluation: PACU Anesthesia Type: General Level of consciousness: awake and alert Pain management: pain level controlled Vital Signs Assessment: post-procedure vital signs reviewed and stable Respiratory status: spontaneous breathing, nonlabored ventilation and respiratory function stable Cardiovascular status: blood pressure returned to baseline and stable Postop Assessment: no apparent nausea or vomiting Anesthetic complications: no     Last Vitals:  Vitals:   03/16/20 1329 03/16/20 1406  BP: 117/69 (!) 150/89  Pulse: 75 92  Resp: 14 18  Temp: 36.7 C 36.5 C  SpO2: 100% 98%    Last Pain:  Vitals:   03/16/20 1406  TempSrc: Oral  PainSc:                  Karleen Hampshire

## 2020-03-16 NOTE — Progress Notes (Addendum)
PROGRESS NOTE    Albert Rice  CBS:[id number] DOB: 04/05/1976 DOA: 03/14/2020 PCP: Patient, No Pcp Per     Assessment & Plan:   Principal Problem:   Hyperkalemia Active Problems:   Obesity, Class III, BMI 40-49.9 (morbid obesity) (HCC)   Type 2 diabetes mellitus with complication, without long-term current use of insulin (HCC)   Cholelithiasis   Abdominal pain   Hyperkalemia:etiology unclear, possibly secondary ACE-I use.  Will d/c home dose of lisinopril. S/p bicarb, veltassa. Resolved. Nephro following and recs apprec   DM2:continue on SSI w/ accuchecks   Acute cholecystitis: HIDA scan positive for likely cholecystitis. S/p cholecystectomy 03/16/20 as per gen surg    Leukocytosis: likely secondary to above. Continue on IV abxs   Morbid obesity: BMI 42.5. Would benefit from weight loss   Hypertension:continue on home dose of amlodipine. D/C home dose of lisinopril secondary to hyperkalemia   COVID infection: s/p vaccination. Had covid few months ago.    DVT prophylaxis: SCDs Code Status:  Full  Family Communication: discussed pt's care w/ pt's family who was at bedside and answered their questions  Disposition Plan: likely d/c home. Unlikely any barriers   Consultants:   Gen surg    Procedures:    Antimicrobials: zosyn    Subjective: Pt c/o nausea earlier today  Objective: Vitals:   03/16/20 0359 03/16/20 0726 03/16/20 0754 03/16/20 0755  BP: (!) 162/92 (!) 160/86  (!) 157/88  Pulse: 90 83  91  Resp: 16 18 18    Temp: 98.8 F (37.1 C) 98.6 F (37 C) 98.6 F (37 C)   TempSrc: Oral  Temporal   SpO2: 100% 99%  99%  Weight:      Height:        Intake/Output Summary (Last 24 hours) at 03/16/2020 0841 Last data filed at 03/16/2020 05/16/2020 Gross per 24 hour  Intake 543.83 ml  Output --  Net 543.83 ml   Filed Weights   03/14/20 1307  Weight: (!) 138.3 kg    Examination:  General exam: Appears calm and comfortable but lethargic  Respiratory  system: diminished breath sounds b/l otherwise clear Cardiovascular system: S1 & S2 +. No rubs, gallops or clicks.  Gastrointestinal system: Abdomen is nondistended, soft and nontender. Normal bowel sounds heard. Central nervous system: Alert and oriented. Moves all 4 extremities. Lethargic Psychiatry: Judgement and insight appear normal.     Data Reviewed: I have personally reviewed following labs and imaging studies  CBC: Recent Labs  Lab 03/14/20 1321 03/15/20 0354 03/16/20 0641  WBC 8.2 11.0* 13.4*  HGB 12.6* 12.0* 12.5*  HCT 37.7* 34.6* 36.7*  MCV 78.2* 77.2* 77.3*  PLT 182 174 188   Basic Metabolic Panel: Recent Labs  Lab 03/14/20 1321 03/14/20 1818 03/15/20 0354 03/16/20 0641  NA 136 138 141 140  K 5.9* 6.5* 5.1 4.6  CL 111 114* 112* 111  CO2 18* 18* 20* 21*  GLUCOSE 257* 191* 216* 187*  BUN 31* 28* 23* 19  CREATININE 1.41* 1.31* 1.28* 1.44*  CALCIUM 8.7* 8.6* 8.6* 8.7*  MG  --   --  1.7 1.8  PHOS  --   --   --  2.8   GFR: Estimated Creatinine Clearance: 94 mL/min (A) (by C-G formula based on SCr of 1.44 mg/dL (H)). Liver Function Tests: Recent Labs  Lab 03/14/20 1321 03/16/20 0641  AST 13* 15  ALT 13 12  ALKPHOS 42 49  BILITOT 1.1 1.8*  PROT 6.7 6.7  ALBUMIN 3.7 3.2*  Recent Labs  Lab 03/14/20 1321  LIPASE 26   No results for input(s): AMMONIA in the last 168 hours. Coagulation Profile: No results for input(s): INR, PROTIME in the last 168 hours. Cardiac Enzymes: Recent Labs  Lab 03/14/20 1321  CKTOTAL 227   BNP (last 3 results) No results for input(s): PROBNP in the last 8760 hours. HbA1C: No results for input(s): HGBA1C in the last 72 hours. CBG: Recent Labs  Lab 03/14/20 2125 03/16/20 0801  GLUCAP 107* 164*   Lipid Profile: No results for input(s): CHOL, HDL, LDLCALC, TRIG, CHOLHDL, LDLDIRECT in the last 72 hours. Thyroid Function Tests: No results for input(s): TSH, T4TOTAL, FREET4, T3FREE, THYROIDAB in the last 72  hours. Anemia Panel: No results for input(s): VITAMINB12, FOLATE, FERRITIN, TIBC, IRON, RETICCTPCT in the last 72 hours. Sepsis Labs: No results for input(s): PROCALCITON, LATICACIDVEN in the last 168 hours.  Recent Results (from the past 240 hour(s))  SARS Coronavirus 2 by RT PCR (hospital order, performed in [facility] hospital lab) Nasopharyngeal Nasopharyngeal Swab     Status: Abnormal   Collection Time: 03/14/20  8:09 PM   Specimen: Nasopharyngeal Swab  Result Value Ref Range Status   SARS Coronavirus 2 POSITIVE (A) NEGATIVE Final    Comment: RESULT CALLED TO, READ BACK BY AND VERIFIED WITH: ASHLEY MURRAY @2135  03/14/20 AKT (NOTE) SARS-CoV-2 target nucleic acids are DETECTED SARS-CoV-2 RNA is generally detectable in upper respiratory specimens  during the acute phase of infection.  Positive results are indicative  of the presence of the identified virus, but do not rule out bacterial infection or co-infection with other pathogens not detected by the test.  Clinical correlation with patient history and  other diagnostic information is necessary to determine patient infection status.  The expected result is negative. Fact Sheet for Patients:   StrictlyIdeas.no  Fact Sheet for Healthcare Providers:   BankingDealers.co.za   This test is not yet approved or cleared by the [place] FDA and  has been authorized for detection and/or diagnosis of SARS-CoV-2 by FDA under an Emergency Use Authorization (EUA).  This EUA will remain in effect (meaning this test can be u sed) for the duration of  the COVID-19 declaration under Section 564(b)(1) of the Act, 21 U.S.C. section 360-bbb-3(b)(1), unless the authorization is terminated or revoked sooner. Performed at [facility], [address], [place], [place] [address]   Surgical PCR screen     Status: None   Collection Time: 03/16/20 12:55 AM   Specimen: Nasal Mucosa; Nasal Swab   Result Value Ref Range Status   MRSA, PCR NEGATIVE NEGATIVE Final   Staphylococcus aureus NEGATIVE NEGATIVE Final    Comment: (NOTE) The Xpert SA Assay (FDA approved for NASAL specimens in patients 71 years of age and older), is one component of a comprehensive surveillance program. It is not intended to diagnose infection nor to guide or monitor treatment. Performed at [facility], [address] [place], [place] [address]          Radiology Studies: NM Hepatobiliary Liver Func  Result Date: 03/15/2020 CLINICAL DATA:  Cholelithiasis. EXAM: NUCLEAR MEDICINE HEPATOBILIARY IMAGING TECHNIQUE: Sequential images of the abdomen were obtained out to 60 minutes following intravenous administration of radiopharmaceutical. RADIOPHARMACEUTICALS:  5.2 mCi Tc-61m  Choletec IV COMPARISON:  Ultrasound 03/14/2020.  CT 03/14/2020. FINDINGS: Liver, biliary system, and bowel visualize normally. Gallbladder does not visualize out to 3 hours. Findings suggest the possibility cholecystitis. IMPRESSION: Nonvisualization of the gallbladder out to 3 hours.  Findings suggest possibility of cholecystitis. Electronically Signed   By: Maisie Fus  Register   On: 03/15/2020 16:14   CT Abdomen Pelvis W Contrast  Result Date: 03/14/2020 CLINICAL DATA:  Acute abdominal pain.  Non localized. EXAM: CT ABDOMEN AND PELVIS WITH CONTRAST TECHNIQUE: Multidetector CT imaging of the abdomen and pelvis was performed using the standard protocol following bolus administration of intravenous contrast. CONTRAST:  OMNIPAQUE IOHEXOL 300 MG/ML  SOLN COMPARISON:  06/15/2019 FINDINGS: Lower chest: No acute abnormality. Hepatobiliary: Normal liver. Cholelithiasis. No intrahepatic or extrahepatic biliary ductal dilatation. Pancreas: Unremarkable. No pancreatic ductal dilatation or surrounding inflammatory changes. Spleen: Normal in size without focal abnormality. Adrenals/Urinary Tract: Normal adrenal glands. Hypodense masses  throughout bilateral kidneys with the larger masses measuring fluid attenuation. The largest mass in the upper pole of the right kidney measures 2.9 cm. No obstructive uropathy. Stomach/Bowel: Stomach is within normal limits. Appendix appears normal. No evidence of bowel wall thickening, distention, or inflammatory changes. Vascular/Lymphatic: No significant vascular findings are present. No enlarged abdominal or pelvic lymph nodes. Reproductive: Prostate is unremarkable. Other: No abdominal wall hernia or abnormality. No abdominopelvic ascites. Musculoskeletal: No acute osseous abnormality. No aggressive osseous lesion. Degenerative disease with disc height loss at L4-5 and L5-S1. IMPRESSION: 1. No acute abdominal or pelvic pathology. 2. Cholelithiasis. 3. Normal appendix. 4. Bilateral renal cysts. Electronically Signed   By: Elige Ko   On: 03/14/2020 16:17   US Abdomen Limited RUQ  Result Date: 03/14/2020 CLINICAL DATA:  Abdominal pain in the right upper quadrant and nausea starting this morning. EXAM: ULTRASOUND ABDOMEN LIMITED RIGHT UPPER QUADRANT COMPARISON:  CT abdomen pelvis 03/14/2020 FINDINGS: Gallbladder: There are multiple gallstones. There is no gallbladder wall thickening. No pericholecystic fluid. Positive sonographic Murphy sign. Common bile duct: Diameter: 0.5 cm, within normal limits. Liver: No focal lesion identified. Within normal limits in parenchymal echogenicity. Portal vein is patent on color Doppler imaging with normal direction of blood flow towards the liver. Other: None. IMPRESSION: Cholelithiasis and positive sonographic Murphy sign. No gallbladder wall thickening or pericholecystic fluid. Findings are indeterminate for acute cholecystitis. A nuclear medicine HIDA scan could be performed for further evaluation. Electronically Signed   By: Emmaline Kluver M.D.   On: 03/14/2020 18:17        Scheduled Meds: . [MAR Hold] patiromer  8.4 g Oral Daily  . [MAR Hold] sodium  bicarbonate  1,300 mg Oral BID   Continuous Infusions: . sodium chloride 125 mL/hr at 03/15/20 2049  . [MAR Hold] piperacillin-tazobactam (ZOSYN)  IV 3.375 g (03/16/20 0509)     LOS: 2 days    Time spent: 31 mins     Charise Killian, MD [facility] Pager [phone]  If 7PM-7AM, please contact night-coverage [email or web address] 03/16/2020, 8:41 AM

## 2020-03-17 DIAGNOSIS — D638 Anemia in other chronic diseases classified elsewhere: Secondary | ICD-10-CM

## 2020-03-17 LAB — COMPREHENSIVE METABOLIC PANEL
ALT: 120 U/L — ABNORMAL HIGH (ref 0–44)
AST: 71 U/L — ABNORMAL HIGH (ref 15–41)
Albumin: 2.6 g/dL — ABNORMAL LOW (ref 3.5–5.0)
Alkaline Phosphatase: 49 U/L (ref 38–126)
Anion gap: 8 (ref 5–15)
BUN: 28 mg/dL — ABNORMAL HIGH (ref 6–20)
CO2: 21 mmol/L — ABNORMAL LOW (ref 22–32)
Calcium: 7.7 mg/dL — ABNORMAL LOW (ref 8.9–10.3)
Chloride: 110 mmol/L (ref 98–111)
Creatinine, Ser: 2.07 mg/dL — ABNORMAL HIGH (ref 0.61–1.24)
GFR calc Af Amer: 44 mL/min — ABNORMAL LOW (ref >60)
GFR calc non Af Amer: 38 mL/min — ABNORMAL LOW (ref >60)
Glucose, Bld: 186 mg/dL — ABNORMAL HIGH (ref 70–99)
Potassium: 4.7 mmol/L (ref 3.5–5.1)
Sodium: 139 mmol/L (ref 135–145)
Total Bilirubin: 0.9 mg/dL (ref 0.3–1.2)
Total Protein: 5.9 g/dL — ABNORMAL LOW (ref 6.5–8.1)

## 2020-03-17 LAB — CBC
HCT: 32 % — ABNORMAL LOW (ref 39.0–52.0)
Hemoglobin: 10.6 g/dL — ABNORMAL LOW (ref 13.0–17.0)
MCH: 26.2 pg (ref 26.0–34.0)
MCHC: 33.1 g/dL (ref 30.0–36.0)
MCV: 79 fL — ABNORMAL LOW (ref 80.0–100.0)
Platelets: 163 10*3/uL (ref 150–400)
RBC: 4.05 MIL/uL — ABNORMAL LOW (ref 4.22–5.81)
RDW: 14.1 % (ref 11.5–15.5)
WBC: 11.8 10*3/uL — ABNORMAL HIGH (ref 4.0–10.5)
nRBC: 0 % (ref 0.0–0.2)

## 2020-03-17 LAB — HEMOGLOBIN A1C
Hgb A1c MFr Bld: 6.5 % — ABNORMAL HIGH (ref 4.8–5.6)
Mean Plasma Glucose: 139.85 mg/dL

## 2020-03-17 LAB — IRON AND TIBC
Iron: 12 ug/dL — ABNORMAL LOW (ref 45–182)
Saturation Ratios: 6 % — ABNORMAL LOW (ref 17.9–39.5)
TIBC: 186 ug/dL — ABNORMAL LOW (ref 250–450)
UIBC: 174 ug/dL

## 2020-03-17 LAB — GLUCOSE, CAPILLARY
Glucose-Capillary: 175 mg/dL — ABNORMAL HIGH (ref 70–99)
Glucose-Capillary: 147 mg/dL — ABNORMAL HIGH (ref 70–99)

## 2020-03-17 LAB — FERRITIN: Ferritin: 576 ng/mL — ABNORMAL HIGH (ref 24–336)

## 2020-03-17 LAB — MAGNESIUM: Magnesium: 1.7 mg/dL (ref 1.7–2.4)

## 2020-03-17 LAB — PHOSPHORUS: Phosphorus: 4.1 mg/dL (ref 2.5–4.6)

## 2020-03-17 MED ORDER — INSULIN ASPART 100 UNIT/ML ~~LOC~~ SOLN: 0.0000 [IU] | Freq: Every day | SUBCUTANEOUS | Status: DC

## 2020-03-17 MED ORDER — FERROUS GLUCONATE 324 (38 FE) MG PO TABS
324.0000 mg | ORAL_TABLET | Freq: Two times a day (BID) | ORAL | Status: DC
  Administered 2020-03-18: 324 mg via ORAL
  Filled 2020-03-17 (×3): qty 1

## 2020-03-17 MED ORDER — INSULIN ASPART 100 UNIT/ML ~~LOC~~ SOLN
0.0000 [IU] | Freq: Three times a day (TID) | SUBCUTANEOUS | Status: DC
  Administered 2020-03-17 – 2020-03-18 (×2): 2 [IU] via SUBCUTANEOUS
  Filled 2020-03-17 (×2): qty 1

## 2020-03-17 NOTE — Progress Notes (Addendum)
PROGRESS NOTE    Albert Rice  TDH:[id number] DOB: 18-Sep-1976 DOA: 03/14/2020 PCP: Patient, No Pcp Per     Assessment & Plan:   Principal Problem:   Hyperkalemia Active Problems:   Obesity, Class III, BMI 40-49.9 (morbid obesity) (HCC)   Type 2 diabetes mellitus with complication, without long-term current use of insulin (HCC)   Cholelithiasis   Abdominal pain   Hyperkalemia:etiology unclear, possibly secondary ACE-I use.  Will d/c home dose of lisinopril. S/p bicarb, veltassa. Resolved. 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 Cr is trending up today. Continue on IVFs. Will f/u w/ Dr. Cherylann Ratel 3 weeks after d/c   Anemia of chronic disease: likely secondary to CKD. Will start iron supplements   Transaminitis: likely secondary to severe cholecystitis. Will continue to monitor   Acute severe cholecystitis: HIDA scan positive for likely cholecystitis. S/p cholecystectomy 03/16/20 as per gen surg. Continue on IV zosyn and will need po abxs at d/c   DM2:continue on SSI w/ accuchecks   Acute severe cholecystitis: HIDA scan positive for likely cholecystitis. S/p cholecystectomy 03/16/20 as per gen surg. Continue on IV zosyn   Leukocytosis: likely secondary to above. Continue on IV abxs   Morbid obesity: BMI 42.5. Would benefit from weight loss   Hypertension:continue on home dose of amlodipine. D/C home dose of lisinopril secondary to hyperkalemia   COVID infection: s/p vaccination. Had covid few months ago.    DVT prophylaxis: SCDs Code Status:  Full  Family Communication: discussed pt's care w/ pt's family who was at bedside and answered their questions  Disposition Plan: likely d/c home tomorrow if Cr & LFTs have improved. Unlikely any barriers  Status is: Inpatient  Remains inpatient appropriate because:Persistent severe electrolyte disturbances and IV treatments appropriate due to intensity of illness or inability to take  PO   Dispo: The patient is from: Home              Anticipated d/c is to: Home              Anticipated d/c date is: 1 day              Patient currently is not medically stable to d/c.       Consultants:   Gen surg    Procedures:    Antimicrobials: zosyn    Subjective: Pt c/o abd pain  Objective: Vitals:   03/16/20 1314 03/16/20 1329 03/16/20 1406 03/17/20 0042  BP: 125/68 117/69 (!) 150/89 119/73  Pulse: 82 75 92 81  Resp: 13 14 18 16   Temp:  98 F (36.7 C) 97.7 F (36.5 C) 98.9 F (37.2 C)  TempSrc:   Oral Oral  SpO2: 100% 100% 98% 97%  Weight:      Height:        Intake/Output Summary (Last 24 hours) at 03/17/2020 0734 Last data filed at 03/16/2020 2000 Gross per 24 hour  Intake 2226.56 ml  Output 280 ml  Net 1946.56 ml   Filed Weights   03/14/20 1307  Weight: (!) 138.3 kg    Examination:  General exam: Appears calm and comfortable Respiratory system: decreased breath sounds b/l otherwise clear. No wheezes Cardiovascular system: S1 & S2 +. No rubs, gallops or clicks.  Gastrointestinal system: Abdomen is nondistended, soft and tenderness to palpation. Hypoactive bowel sounds heard. Central nervous system: Alert and oriented. Moves all 4 extremities.  Psychiatry: Judgement and insight appear normal. Flat mood and affect  Data Reviewed: I have personally reviewed following labs and imaging studies  CBC: Recent Labs  Lab 03/14/20 1321 03/15/20 0354 03/16/20 0641 03/17/20 0532  WBC 8.2 11.0* 13.4* 11.8*  HGB 12.6* 12.0* 12.5* 10.6*  HCT 37.7* 34.6* 36.7* 32.0*  MCV 78.2* 77.2* 77.3* 79.0*  PLT 182 174 188 573   Basic Metabolic Panel: Recent Labs  Lab 03/14/20 1321 03/14/20 1818 03/15/20 0354 03/16/20 0641 03/17/20 0532  NA 136 138 141 140 139  K 5.9* 6.5* 5.1 4.6 4.7  CL 111 114* 112* 111 110  CO2 18* 18* 20* 21* 21*  GLUCOSE 257* 191* 216* 187* 186*  BUN 31* 28* 23* 19 28*  CREATININE 1.41* 1.31* 1.28* 1.44* 2.07*  CALCIUM  8.7* 8.6* 8.6* 8.7* 7.7*  MG  --   --  1.7 1.8 1.7  PHOS  --   --   --  2.8 4.1   GFR: Estimated Creatinine Clearance: 65.4 mL/min (A) (by C-G formula based on SCr of 2.07 mg/dL (H)). Liver Function Tests: Recent Labs  Lab 03/14/20 1321 03/16/20 0641 03/17/20 0532  AST 13* 15 71*  ALT 13 12 120*  ALKPHOS 42 49 49  BILITOT 1.1 1.8* 0.9  PROT 6.7 6.7 5.9*  ALBUMIN 3.7 3.2* 2.6*   Recent Labs  Lab 03/14/20 1321  LIPASE 26   No results for input(s): AMMONIA in the last 168 hours. Coagulation Profile: No results for input(s): INR, PROTIME in the last 168 hours. Cardiac Enzymes: Recent Labs  Lab 03/14/20 1321  CKTOTAL 227   BNP (last 3 results) No results for input(s): PROBNP in the last 8760 hours. HbA1C: No results for input(s): HGBA1C in the last 72 hours. CBG: Recent Labs  Lab 03/14/20 2125 03/16/20 0801 03/16/20 1200  GLUCAP 107* 164* 251*   Lipid Profile: No results for input(s): CHOL, HDL, LDLCALC, TRIG, CHOLHDL, LDLDIRECT in the last 72 hours. Thyroid Function Tests: No results for input(s): TSH, T4TOTAL, FREET4, T3FREE, THYROIDAB in the last 72 hours. Anemia Panel: Recent Labs    03/17/20 0532  FERRITIN 576*  TIBC 186*  IRON 12*   Sepsis Labs: No results for input(s): PROCALCITON, LATICACIDVEN in the last 168 hours.  Recent Results (from the past 240 hour(s))  SARS Coronavirus 2 by RT PCR (hospital order, performed in [facility] hospital lab) Nasopharyngeal Nasopharyngeal Swab     Status: Abnormal   Collection Time: 03/14/20  8:09 PM   Specimen: Nasopharyngeal Swab  Result Value Ref Range Status   SARS Coronavirus 2 POSITIVE (A) NEGATIVE Final    Comment: RESULT CALLED TO, READ BACK BY AND VERIFIED WITH: ASHLEY MURRAY @2135  03/14/20 AKT (NOTE) SARS-CoV-2 target nucleic acids are DETECTED SARS-CoV-2 RNA is generally detectable in upper respiratory specimens  during the acute phase of infection.  Positive results are indicative  of the presence  of the identified virus, but do not rule out bacterial infection or co-infection with other pathogens not detected by the test.  Clinical correlation with patient history and  other diagnostic information is necessary to determine patient infection status.  The expected result is negative. Fact Sheet for Patients:   StrictlyIdeas.no  Fact Sheet for Healthcare Providers:   BankingDealers.co.za   This test is not yet approved or cleared by the [place] FDA and  has been authorized for detection and/or diagnosis of SARS-CoV-2 by FDA under an Emergency Use Authorization (EUA).  This EUA will remain in effect (meaning this test can be u sed) for the duration of  the COVID-19 declaration under Section 564(b)(1) of the Act, 21 U.S.C. section 360-bbb-3(b)(1), unless the authorization is terminated or revoked sooner. Performed at [facility], [address] [place], [place] [address]   Surgical PCR screen     Status: None   Collection Time: 03/16/20 12:55 AM   Specimen: Nasal Mucosa; Nasal Swab  Result Value Ref Range Status   MRSA, PCR NEGATIVE NEGATIVE Final   Staphylococcus aureus NEGATIVE NEGATIVE Final    Comment: (NOTE) The Xpert SA Assay (FDA approved for NASAL specimens in patients 29 years of age and older), is one component of a comprehensive surveillance program. It is not intended to diagnose infection nor to guide or monitor treatment. Performed at [facility], [address], [place], [place] [address]          Radiology Studies: NM Hepatobiliary Liver Func  Result Date: 03/15/2020 CLINICAL DATA:  Cholelithiasis. EXAM: NUCLEAR MEDICINE HEPATOBILIARY IMAGING TECHNIQUE: Sequential images of the abdomen were obtained out to 60 minutes following intravenous administration of radiopharmaceutical. RADIOPHARMACEUTICALS:  5.2 mCi Tc-27m  Choletec IV COMPARISON:  Ultrasound 03/14/2020.  CT 03/14/2020.  FINDINGS: Liver, biliary system, and bowel visualize normally. Gallbladder does not visualize out to 3 hours. Findings suggest the possibility cholecystitis. IMPRESSION: Nonvisualization of the gallbladder out to 3 hours. Findings suggest possibility of cholecystitis. Electronically Signed   By: Maisie Fus  Register   On: 03/15/2020 16:14        Scheduled Meds: . acetaminophen  1,000 mg Oral Q6H  . amLODipine  10 mg Oral Daily  . celecoxib  200 mg Oral BID  . gabapentin  300 mg Oral BID  . patiromer  8.4 g Oral Daily  . sodium bicarbonate  1,300 mg Oral BID   Continuous Infusions: . sodium chloride 125 mL/hr at 03/16/20 2134  . piperacillin-tazobactam (ZOSYN)  IV 3.375 g (03/17/20 0547)     LOS: 3 days    Time spent: 33 mins     Charise Killian, MD [facility] Pager [phone]  If 7PM-7AM, please contact night-coverage [email or web address] 03/17/2020, 7:34 AM

## 2020-03-17 NOTE — Progress Notes (Addendum)
[place] Hospital Day(s): 3.   Post op day(s): 1 Day Post-Op.   Interval History:  Patient seen and examined no acute events or new complaints overnight.  Patient reports he is feeling better this morning, some abdominal soreness No chills, nausea, or emesis Leukocytosis improved to 11.8K this morning; no fevers He did have worsening of renal function, sCr - 2.07 (from 1.44 yesterday), UO unmeasured No significant electrolyte derangement Surgical drain with 180 ccs out He has been on CLD post-op + Bowel function   Vital signs in last 24 hours: [min-max] current  Temp:  [97.7 F (36.5 C)-98.9 F (37.2 C)] 98.9 F (37.2 C) (06/03 0042) Pulse Rate:  [75-95] 81 (06/03 0042) Resp:  [11-18] 16 (06/03 0042) BP: (113-160)/(66-89) 119/73 (06/03 0042) SpO2:  [96 %-100 %] 97 % (06/03 0042)     Height: 5\' 11"  (180.3 cm) Weight: (!) 138.3 kg BMI (Calculated): 42.56   Intake/Output last 2 shifts:  06/02 0701 - 06/03 0700 In: 2226.6 [P.O.:620; I.V.:1500; IV Piggyback:106.6] Out: 280 [Drains:180; Blood:100]   Physical Exam:  Constitutional: alert, cooperative and no distress  Respiratory: breathing non-labored at rest  Cardiovascular: regular rate and sinus rhythm  Gastrointestinal: soft, non-tender, and non-distended, surgical drain in RUQ with serosanguinous output  Integumentary: Laparoscopic incisions are CDI with steri-strips, no erythema or drainage   Labs:  CBC Latest Ref Rng & Units 03/17/2020 03/16/2020 03/15/2020  WBC 4.0 - 10.5 K/uL 11.8(H) 13.4(H) 11.0(H)  Hemoglobin 13.0 - 17.0 g/dL 10.6(L) 12.5(L) 12.0(L)  Hematocrit 39.0 - 52.0 % 32.0(L) 36.7(L) 34.6(L)  Platelets 150 - 400 K/uL 163 188 174   CMP Latest Ref Rng & Units 03/17/2020 03/16/2020 03/15/2020  Glucose 70 - 99 mg/dL 186(H) 187(H) 216(H)  BUN 6 - 20 mg/dL 28(H) 19 23(H)  Creatinine 0.61 - 1.24 mg/dL 2.07(H) 1.44(H) 1.28(H)  Sodium 135 - 145 mmol/L 139 140 141  Potassium 3.5 -  5.1 mmol/L 4.7 4.6 5.1  Chloride 98 - 111 mmol/L 110 111 112(H)  CO2 22 - 32 mmol/L 21(L) 21(L) 20(L)  Calcium 8.9 - 10.3 mg/dL 7.7(L) 8.7(L) 8.6(L)  Total Protein 6.5 - 8.1 g/dL 5.9(L) 6.7 -  Total Bilirubin 0.3 - 1.2 mg/dL 0.9 1.8(H) -  Alkaline Phos 38 - 126 U/L 49 49 -  AST 15 - 41 U/L 71(H) 15 -  ALT 0 - 44 U/L 120(H) 12 -     Imaging studies: No new pertinent imaging studies   Assessment/Plan:  44 y.o. male doing well with slight bump in sCr 1 Day Post-Op s/p laparoscopic cholecystectomy for acute cholecystitis   - Okay to advance diet as tolerated, however, patient prefers CLD for now  - Would continue IVF resuscitation today; appears to have degree of under resuscitation post-operatively  - Continue IV ABx (Zosyn); will need PO for home when ready  - Pain control prn; antiemetics prn  - Monitor abdominal examination; on-going bowel function  - Continue drain; monitor and record output; will likely go home with this   - Mobilization encouraged   - further management per primary service; we will follow   - He will benefit from nephrology referral at discharge  All of the above findings and recommendations were discussed with the patient, and the medical team, and all of patient's questions were answered to his expressed satisfaction.  -- Edison Simon, PA-C [facility] 03/17/2020, 6:41 AM [phone] M-F: 7am - 4pm  I saw and evaluated the patient.  I  agree with the above documentation, exam, and plan, which I have edited where appropriate. Duanne Guess  8:50 AM

## 2020-03-18 DIAGNOSIS — E875 Hyperkalemia: Secondary | ICD-10-CM

## 2020-03-18 LAB — BASIC METABOLIC PANEL
Anion gap: 6 (ref 5–15)
BUN: 22 mg/dL — ABNORMAL HIGH (ref 6–20)
CO2: 22 mmol/L (ref 22–32)
Calcium: 7.5 mg/dL — ABNORMAL LOW (ref 8.9–10.3)
Chloride: 111 mmol/L (ref 98–111)
Creatinine, Ser: 1.59 mg/dL — ABNORMAL HIGH (ref 0.61–1.24)
GFR calc Af Amer: >60 mL/min (ref >60)
GFR calc non Af Amer: 52 mL/min — ABNORMAL LOW (ref >60)
Glucose, Bld: 150 mg/dL — ABNORMAL HIGH (ref 70–99)
Potassium: 4.2 mmol/L (ref 3.5–5.1)
Sodium: 139 mmol/L (ref 135–145)

## 2020-03-18 LAB — CBC
HCT: 31.9 % — ABNORMAL LOW (ref 39.0–52.0)
Hemoglobin: 10.8 g/dL — ABNORMAL LOW (ref 13.0–17.0)
MCH: 26.4 pg (ref 26.0–34.0)
MCHC: 33.9 g/dL (ref 30.0–36.0)
MCV: 78 fL — ABNORMAL LOW (ref 80.0–100.0)
Platelets: 176 10*3/uL (ref 150–400)
RBC: 4.09 MIL/uL — ABNORMAL LOW (ref 4.22–5.81)
RDW: 14.1 % (ref 11.5–15.5)
WBC: 7.9 10*3/uL (ref 4.0–10.5)
nRBC: 0 % (ref 0.0–0.2)

## 2020-03-18 LAB — ALT: ALT: 93 U/L — ABNORMAL HIGH (ref 0–44)

## 2020-03-18 LAB — GLUCOSE, CAPILLARY
Glucose-Capillary: 185 mg/dL — ABNORMAL HIGH (ref 70–99)
Glucose-Capillary: 169 mg/dL — ABNORMAL HIGH (ref 70–99)

## 2020-03-18 LAB — AST: AST: 38 U/L (ref 15–41)

## 2020-03-18 MED ORDER — FERROUS GLUCONATE 324 (38 FE) MG PO TABS: 324.0000 mg | ORAL_TABLET | Freq: Two times a day (BID) | ORAL | 0 refills | Status: AC

## 2020-03-18 MED ORDER — OXYCODONE HCL 5 MG PO TABS: 5.0000 mg | ORAL_TABLET | Freq: Three times a day (TID) | ORAL | 0 refills | Status: AC | PRN

## 2020-03-18 MED ORDER — AMOXICILLIN-POT CLAVULANATE 875-125 MG PO TABS: 1.0000 | ORAL_TABLET | Freq: Two times a day (BID) | ORAL | 0 refills | Status: AC

## 2020-03-18 NOTE — Progress Notes (Signed)
Gave instruction and demonstration to patient and his wife on how to drain his JP drain at home. Wife verbalized understanding once I was finished.  At discharge, I showed patient how to change his dressing and provided gauze and tegaderm dressings for him to change at home. Patient verbalized understanding and thanked me for my instruction and help.

## 2020-03-18 NOTE — Progress Notes (Signed)
Previous positive test was done on 12/17/19 at [facility]. Confirmed by [facility].

## 2020-03-18 NOTE — Progress Notes (Addendum)
[facility] SURGICAL PROGRESS NOTE  Hospital Day(s): 4.   Post op day(s): 2 Days Post-Op.   Interval History:  Patient seen and examined no acute events or new complaints overnight.  Patient reports he continues to feel better No fever, chills, nausea, or emeis Leukocytosis resolved, WBC 7.9K; no fevers Renal function improved, sCr - 1.59 (which I believe is closer to his likely baseline), UO unmeasured Surgical drain with 230 ccs out, serosanguinous Advanced to carb modified diet; tolerating well Mobilizing well No other issues   Vital signs in last 24 hours: [min-max] current  Temp:  [98.2 F (36.8 C)-99 F (37.2 C)] 99 F (37.2 C) (06/03 2319) Pulse Rate:  [85-86] 85 (06/03 2319) Resp:  [16-18] 16 (06/03 2319) BP: (118-120)/(59-85) 118/59 (06/03 2319) SpO2:  [97 %-100 %] 97 % (06/03 2319)     Height: 5\' 11"  (180.3 cm) Weight: (!) 138.3 kg BMI (Calculated): 42.56   Intake/Output last 2 shifts:  06/03 0701 - 06/04 0700 In: -  Out: 230 [Drains:230]   Physical Exam:  Constitutional: alert, cooperative and no distress  Respiratory: breathing non-labored at rest  Cardiovascular: regular rate and sinus rhythm  Gastrointestinal: soft, non-tender, and non-distended, surgical drain in RUQ with serosanguinous output Integumentary: Laparoscopic incisions are CDI with steri-strips, no erythema or drainage   Labs:  CBC Latest Ref Rng & Units 03/18/2020 03/17/2020 03/16/2020  WBC 4.0 - 10.5 K/uL 7.9 11.8(H) 13.4(H)  Hemoglobin 13.0 - 17.0 g/dL 10.8(L) 10.6(L) 12.5(L)  Hematocrit 39.0 - 52.0 % 31.9(L) 32.0(L) 36.7(L)  Platelets 150 - 400 K/uL 176 163 188   CMP Latest Ref Rng & Units 03/18/2020 03/17/2020 03/16/2020  Glucose 70 - 99 mg/dL 05/16/2020) 952(W) 413(K)  BUN 6 - 20 mg/dL 440(N) 02(V) 19  Creatinine 0.61 - 1.24 mg/dL 25(D) 6.64(Q) 0.34(V)  Sodium 135 - 145 mmol/L 139 139 140  Potassium 3.5 - 5.1 mmol/L 4.2 4.7 4.6  Chloride 98 - 111 mmol/L 111 110 111  CO2 22 - 32  mmol/L 22 21(L) 21(L)  Calcium 8.9 - 10.3 mg/dL 7.5(L) 7.7(L) 8.7(L)  Total Protein 6.5 - 8.1 g/dL - 5.9(L) 6.7  Total Bilirubin 0.3 - 1.2 mg/dL - 0.9 4.25(Z)  Alkaline Phos 38 - 126 U/L - 49 49  AST 15 - 41 U/L - 71(H) 15  ALT 0 - 44 U/L - 120(H) 12     Imaging studies: No new pertinent imaging studies   Assessment/Plan:  44 y.o. male doing well 2 Days Post-Op s/p laparoscopic cholecystectomy for acute cholecystitis   - Continue diet as tolerates   - Discontinue IVF resuscitation    - Continue IV ABx (Zosyn); will need PO (Augmentin) for home when ready   - Pain control prn; antiemetics prn             - Monitor abdominal examination; on-going bowel function             - Continue drain; monitor and record output; will go home with this; provide drain teaching             - Mobilization encouraged     - Discharge Planning: stable for discharge from surgical standpoint, will go home with drain, needs PO ABx to complete 10 days total therapy, follow up with general surgery later next week  All of the above findings and recommendations were discussed with the patient, and the medical team, and all of patient's questions were answered to his expressed satisfaction.  -- 55,  PA-C [facility] 03/18/2020, 6:39 AM [phone] M-F: 7am - 4pm  I saw and evaluated the patient.  I agree with the above documentation, exam, and plan, which I have edited where appropriate. Fredirick Maudlin  9:39 AM

## 2020-03-18 NOTE — Discharge Summary (Signed)
Physician Discharge Summary  Albert Rice QPY:[id number] DOB: 07-08-1976 DOA: 03/14/2020  PCP: Patient, No Pcp Per  Admit date: 03/14/2020 Discharge date: 03/18/2020  Admitted From: home Disposition:  home  Recommendations for Outpatient Follow-up:  1. Follow up with PCP in 1-2 weeks 2. F/u gen surg, Dr. Celine Ahr, in 1 week 3. F/u nephro, Dr. Holley Raring, in 3 weeks   Home Health: no  Equipment/Devices: JP drain s/p cholecystectomy   Discharge Condition: stable  CODE STATUS: full  Diet recommendation: Heart Healthy / Carb Modified    Brief/Interim Summary: HPI was taken from Dr. Jonelle Sidle: Albert Rice is a 44 y.o. male with medical history significant of diabetes, hypertension, hyperlipidemia, chronic kidney disease stage III, recent hyperkalemia who presented to the ER with abdominal pain nausea vomiting.  Symptoms started apparently today.  Pain was rated a 7 out of 10 in his epigastric region.  No significant radiation.  No association with diet.  Patient seen in the ER evaluated and found to have potassium of more than 6.  He had a similar episode back in March which was treated.  No other corresponding source.  Nephrology consulted and recommends admission with treatment of his hyperkalemia acutely.  Patient equally had cholelithiasis on abdominal ultrasound which may be responsible for his abdominal pain.  General surgery consulted and recommends HIDA scan and will follow.  Is being admitted to the medical service therefore..  ED Course: Temperature 98 blood pressure 136/98 pulse 109 respiratory 20 oxygen sat 95% on room air.  Sodium 138 potassium 6.5 chloride 114 CO2 of 18 glucose 257 BUN 31 creatinine 1.41 and the rest of the LFTs are within normal also CBC entirely within normal.  Urinalysis negative.  Abdominal ultrasound showed cholelithiasis.  CT abdomen pelvis essentially within normal.  Patient received Veltassa, bicarbonate, insulin and IV fluids.  He is being admitted for further work-up  and evaluation  Hospital Course from Dr. Lenna Sciara. Jimmye Norman 6/2-03/18/20: Pt was found to have hyperkalemia secondary to ACE-I use. ACE-I was d/c. Also, pt was found to have AKI on CKD which was treated conservatively w/ IVFs. Pt's Cr was trending down on day of d/c to 1.59. Pt will f/u outpatient w/ Dr. Holley Raring in 3 weeks. Pt verbalized his understanding. Furthermore, pt was found to have acute severe cholecystitis and had lap cholecystectomy done by gen surg. A JP drain was placed during surgery and pt was d/c home w/ the JP drain. The pt and the pt's wife were taught how to record the output as well as empty the drain. Pt was d/c home w/ po augmentin to complete his abx course. Pt will f/u outpatient w/ Dr. Celine Ahr (gen surgery) in 1 week for further evaluation. Pt verbalized his understanding. Furthermore, pt was found to have ACD & IDA and was started on iron supplements.   Discharge Diagnoses:  Principal Problem:   Hyperkalemia Active Problems:   Obesity, Class III, BMI 40-49.9 (morbid obesity) (HCC)   Type 2 diabetes mellitus with complication, without long-term current use of insulin (HCC)   Cholelithiasis   Abdominal pain  Hyperkalemia:etiology unclear, possibly secondary ACE-I use.  Will d/c home dose of lisinopril. S/p bicarb, veltassa. Resolved. 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 Cr is trending down today. Continue on IVFs. Will f/u w/ Dr. Holley Raring 3 weeks after d/c   Anemia of chronic disease: likely secondary to CKD. Will continue on iron supplements   Transaminitis: likely secondary to severe cholecystitis. AST  is WNL, ALT is still elevated but trending down. Will continue to monitor   Acute severe cholecystitis: HIDA scan positive for likely cholecystitis. S/p cholecystectomy 03/16/20 as per gen surg. Continue w/ JP drain as per gen surg. Transition to po augmentin at d/c   DM2:continue on SSI w/ accuchecks   Acute severe cholecystitis: HIDA  scan positive for likely cholecystitis. S/p cholecystectomy 03/16/20 as per gen surg. Continue on IV zosyn   Leukocytosis: likely secondary to above. Continue on IV abxs   Morbid obesity: BMI 42.5. Would benefit from weight loss   Hypertension:continue on home dose of amlodipine. D/C home dose of lisinopril secondary to hyperkalemia   COVID infection: s/p vaccination. Had covid few months ago.    Discharge Instructions  Discharge Instructions    Diet - low sodium heart healthy   Complete by: As directed    Discharge instructions   Complete by: As directed    F/U nephro, Dr. Cherylann Ratel, in 3 weeks. F/u gen surg, Dr. Lady Gary, in 1 week. F/u PCP in 1-2 weeks   Discharge wound care:   Complete by: As directed    As per gen surg recommendations   Increase activity slowly   Complete by: As directed      Allergies as of 03/18/2020   No Known Allergies     Medication List    STOP taking these medications   lisinopril 20 MG tablet Commonly known as: ZESTRIL     TAKE these medications   amLODipine 10 MG tablet Commonly known as: NORVASC Take 10 mg by mouth daily.   amoxicillin-clavulanate 875-125 MG tablet Commonly known as: Augmentin Take 1 tablet by mouth 2 (two) times daily for 7 days.   aspirin 81 MG EC tablet Take 81 mg by mouth daily at 12 noon.   chlorthalidone 25 MG tablet Commonly known as: HYGROTON Take 25 mg by mouth daily.   cyclobenzaprine 7.5 MG tablet Commonly known as: FEXMID Take 1 tablet (7.5 mg total) by mouth at bedtime as needed for muscle spasms.   ferrous gluconate 324 MG tablet Commonly known as: FERGON Take 1 tablet (324 mg total) by mouth 2 (two) times daily with a meal.   metFORMIN 1000 MG tablet Commonly known as: GLUCOPHAGE Take 1,000 mg by mouth 2 (two) times daily with a meal.   oxyCODONE 5 MG immediate release tablet Commonly known as: Oxy IR/ROXICODONE Take 1-2 tablets (5-10 mg total) by mouth every 8 (eight) hours as needed for up  to 5 days for moderate pain or severe pain.   pravastatin 20 MG tablet Commonly known as: PRAVACHOL Take 20 mg by mouth daily.   tadalafil 5 MG tablet Commonly known as: CIALIS Take 5 mg by mouth daily.   tamsulosin 0.4 MG Caps capsule Commonly known as: FLOMAX Take 0.4 mg by mouth daily.            Discharge Care Instructions  (From admission, onward)         Start     Ordered   03/18/20 0000  Discharge wound care:    Comments: As per gen surg recommendations   03/18/20 1128         Follow-up Information    Lateef, Munsoor, MD Follow up in 3 week(s).   Specialty: Nephrology Contact information: [address] [place] [address] [phone]        Duanne Guess, MD. Schedule an appointment as soon as possible for a visit in 1 week(s).  Specialty: General Surgery Why: s/p lap chole, has drain Contact information: [address] [place] [address] [phone]          No Known Allergies  Consultations:  General surg: Dr. Lady Garyannon  Nephro: Dr. Cherylann RatelLateef   Procedures/Studies: NM Hepatobiliary Liver Func  Result Date: 03/15/2020 CLINICAL DATA:  Cholelithiasis. EXAM: NUCLEAR MEDICINE HEPATOBILIARY IMAGING TECHNIQUE: Sequential images of the abdomen were obtained out to 60 minutes following intravenous administration of radiopharmaceutical. RADIOPHARMACEUTICALS:  5.2 mCi Tc-2091m  Choletec IV COMPARISON:  Ultrasound 03/14/2020.  CT 03/14/2020. FINDINGS: Liver, biliary system, and bowel visualize normally. Gallbladder does not visualize out to 3 hours. Findings suggest the possibility cholecystitis. IMPRESSION: Nonvisualization of the gallbladder out to 3 hours. Findings suggest possibility of cholecystitis. Electronically Signed   By: Maisie Fushomas  Register   On: 03/15/2020 16:14   CT Abdomen Pelvis W Contrast  Result Date: 03/14/2020 CLINICAL DATA:  Acute abdominal pain.  Non localized. EXAM: CT ABDOMEN AND PELVIS WITH CONTRAST  TECHNIQUE: Multidetector CT imaging of the abdomen and pelvis was performed using the standard protocol following bolus administration of intravenous contrast. CONTRAST:  125mL OMNIPAQUE IOHEXOL 300 MG/ML  SOLN COMPARISON:  06/15/2019 FINDINGS: Lower chest: No acute abnormality. Hepatobiliary: Normal liver. Cholelithiasis. No intrahepatic or extrahepatic biliary ductal dilatation. Pancreas: Unremarkable. No pancreatic ductal dilatation or surrounding inflammatory changes. Spleen: Normal in size without focal abnormality. Adrenals/Urinary Tract: Normal adrenal glands. Hypodense masses throughout bilateral kidneys with the larger masses measuring fluid attenuation. The largest mass in the upper pole of the right kidney measures 2.9 cm. No obstructive uropathy. Stomach/Bowel: Stomach is within normal limits. Appendix appears normal. No evidence of bowel wall thickening, distention, or inflammatory changes. Vascular/Lymphatic: No significant vascular findings are present. No enlarged abdominal or pelvic lymph nodes. Reproductive: Prostate is unremarkable. Other: No abdominal wall hernia or abnormality. No abdominopelvic ascites. Musculoskeletal: No acute osseous abnormality. No aggressive osseous lesion. Degenerative disease with disc height loss at L4-5 and L5-S1. IMPRESSION: 1. No acute abdominal or pelvic pathology. 2. Cholelithiasis. 3. Normal appendix. 4. Bilateral renal cysts. Electronically Signed   By: Elige KoHetal  Patel   On: 03/14/2020 16:17   US Abdomen Limited RUQ  Result Date: 03/14/2020 CLINICAL DATA:  Abdominal pain in the right upper quadrant and nausea starting this morning. EXAM: ULTRASOUND ABDOMEN LIMITED RIGHT UPPER QUADRANT COMPARISON:  CT abdomen pelvis 03/14/2020 FINDINGS: Gallbladder: There are multiple gallstones. There is no gallbladder wall thickening. No pericholecystic fluid. Positive sonographic Murphy sign. Common bile duct: Diameter: 0.5 cm, within normal limits. Liver: No focal lesion  identified. Within normal limits in parenchymal echogenicity. Portal vein is patent on color Doppler imaging with normal direction of blood flow towards the liver. Other: None. IMPRESSION: Cholelithiasis and positive sonographic Murphy sign. No gallbladder wall thickening or pericholecystic fluid. Findings are indeterminate for acute cholecystitis. A nuclear medicine HIDA scan could be performed for further evaluation. Electronically Signed   By: Emmaline KluverNancy  Ballantyne M.D.   On: 03/14/2020 18:17       Subjective: Pt c/o fatigue    Discharge Exam: Vitals:   03/17/20 2319 03/18/20 0817  BP: (!) 118/59 (!) 147/85  Pulse: 85 76  Resp: 16 16  Temp: 99 F (37.2 C) 98.1 F (36.7 C)  SpO2: 97% 100%   Vitals:   03/17/20 0042 03/17/20 0827 03/17/20 2319 03/18/20 0817  BP: 119/73 120/85 (!) 118/59 (!) 147/85  Pulse: 81 86 85 76  Resp: 16 18 16 16   Temp: 98.9 F (37.2 C) 98.2  F (36.8 C) 99 F (37.2 C) 98.1 F (36.7 C)  TempSrc: Oral Oral Oral Oral  SpO2: 97% 100% 97% 100%  Weight:      Height:        General: Pt is alert, awake, not in acute distress Cardiovascular: S1/S2 +, no rubs, no gallops Respiratory: CTA bilaterally, no wheezing, no rhonchi Abdominal: Soft, NT, obese, bowel sounds + Extremities: no cyanosis    The results of significant diagnostics from this hospitalization (including imaging, microbiology, ancillary and laboratory) are listed below for reference.     Microbiology: Recent Results (from the past 240 hour(s))  SARS Coronavirus 2 by RT PCR (hospital order, performed in [facility] hospital lab) Nasopharyngeal Nasopharyngeal Swab     Status: Abnormal   Collection Time: 03/14/20  8:09 PM   Specimen: Nasopharyngeal Swab  Result Value Ref Range Status   SARS Coronavirus 2 POSITIVE (A) NEGATIVE Final    Comment: RESULT CALLED TO, READ BACK BY AND VERIFIED WITH: ASHLEY MURRAY @2135  03/14/20 AKT (NOTE) SARS-CoV-2 target nucleic acids are DETECTED SARS-CoV-2 RNA  is generally detectable in upper respiratory specimens  during the acute phase of infection.  Positive results are indicative  of the presence of the identified virus, but do not rule out bacterial infection or co-infection with other pathogens not detected by the test.  Clinical correlation with patient history and  other diagnostic information is necessary to determine patient infection status.  The expected result is negative. Fact Sheet for Patients:   03/16/20  Fact Sheet for Healthcare Providers:   BoilerBrush.com.cy   This test is not yet approved or cleared by the https://pope.com/ FDA and  has been authorized for detection and/or diagnosis of SARS-CoV-2 by FDA under an Emergency Use Authorization (EUA).  This EUA will remain in effect (meaning this test can be u sed) for the duration of  the COVID-19 declaration under Section 564(b)(1) of the Act, 21 U.S.C. section 360-bbb-3(b)(1), unless the authorization is terminated or revoked sooner. Performed at [facility], [address], [place], [place]   Surgical PCR screen     Status: None   Collection Time: 03/16/20 12:55 AM   Specimen: Nasal Mucosa; Nasal Swab  Result Value Ref Range Status   MRSA, PCR NEGATIVE NEGATIVE Final   Staphylococcus aureus NEGATIVE NEGATIVE Final    Comment: (NOTE) The Xpert SA Assay (FDA approved for NASAL specimens in patients 24 years of age and older), is one component of a comprehensive surveillance program. It is not intended to diagnose infection nor to guide or monitor treatment. Performed at [facility], [address] [place], [place]      Labs: BNP (last 3 results) No results for input(s): BNP in the last 8760 hours. Basic Metabolic Panel: Recent Labs  Lab 03/14/20 1818 03/15/20 0354 03/16/20 0641 03/17/20 0532 03/18/20 0539  NA 138 141 140 139 139  K 6.5* 5.1 4.6 4.7 4.2  CL 114*  112* 111 110 111  CO2 18* 20* 21* 21* 22  GLUCOSE 191* 216* 187* 186* 150*  BUN 28* 23* 19 28* 22*  CREATININE 1.31* 1.28* 1.44* 2.07* 1.59*  CALCIUM 8.6* 8.6* 8.7* 7.7* 7.5*  MG  --  1.7 1.8 1.7  --   PHOS  --   --  2.8 4.1  --    Liver Function Tests: Recent Labs  Lab 03/14/20 1321 03/16/20 0641 03/17/20 0532 03/18/20 0539  AST 13* 15 71* 38  ALT 13 12  120* 93*  ALKPHOS 42 49 49  --   BILITOT 1.1 1.8* 0.9  --   PROT 6.7 6.7 5.9*  --   ALBUMIN 3.7 3.2* 2.6*  --    Recent Labs  Lab 03/14/20 1321  LIPASE 26   No results for input(s): AMMONIA in the last 168 hours. CBC: Recent Labs  Lab 03/14/20 1321 03/15/20 0354 03/16/20 0641 03/17/20 0532 03/18/20 0539  WBC 8.2 11.0* 13.4* 11.8* 7.9  HGB 12.6* 12.0* 12.5* 10.6* 10.8*  HCT 37.7* 34.6* 36.7* 32.0* 31.9*  MCV 78.2* 77.2* 77.3* 79.0* 78.0*  PLT 182 174 188 163 176   Cardiac Enzymes: Recent Labs  Lab 03/14/20 1321  CKTOTAL 227   BNP: Invalid input(s): POCBNP CBG: Recent Labs  Lab 03/16/20 0801 03/16/20 1200 03/17/20 1736 03/17/20 2103 03/18/20 0815  GLUCAP 164* 251* 175* 147* 185*   D-Dimer No results for input(s): DDIMER in the last 72 hours. Hgb A1c Recent Labs    03/17/20 0532  HGBA1C 6.5*   Lipid Profile No results for input(s): CHOL, HDL, LDLCALC, TRIG, CHOLHDL, LDLDIRECT in the last 72 hours. Thyroid function studies No results for input(s): TSH, T4TOTAL, T3FREE, THYROIDAB in the last 72 hours.  Invalid input(s): FREET3 Anemia work up Recent Labs    03/17/20 0532  FERRITIN 576*  TIBC 186*  IRON 12*   Urinalysis    Component Value Date/Time   COLORURINE STRAW (A) 03/14/2020 2009   APPEARANCEUR CLEAR (A) 03/14/2020 2009   LABSPEC 1.020 03/14/2020 2009   PHURINE 6.0 03/14/2020 2009   GLUCOSEU NEGATIVE 03/14/2020 2009   HGBUR SMALL (A) 03/14/2020 2009   BILIRUBINUR NEGATIVE 03/14/2020 2009   KETONESUR NEGATIVE 03/14/2020 2009   PROTEINUR 100 (A) 03/14/2020 2009   NITRITE  NEGATIVE 03/14/2020 2009   LEUKOCYTESUR NEGATIVE 03/14/2020 2009   Sepsis Labs Invalid input(s): PROCALCITONIN,  WBC,  LACTICIDVEN Microbiology Recent Results (from the past 240 hour(s))  SARS Coronavirus 2 by RT PCR (hospital order, performed in [facility] hospital lab) Nasopharyngeal Nasopharyngeal Swab     Status: Abnormal   Collection Time: 03/14/20  8:09 PM   Specimen: Nasopharyngeal Swab  Result Value Ref Range Status   SARS Coronavirus 2 POSITIVE (A) NEGATIVE Final    Comment: RESULT CALLED TO, READ BACK BY AND VERIFIED WITH: ASHLEY MURRAY @2135  03/14/20 AKT (NOTE) SARS-CoV-2 target nucleic acids are DETECTED SARS-CoV-2 RNA is generally detectable in upper respiratory specimens  during the acute phase of infection.  Positive results are indicative  of the presence of the identified virus, but do not rule out bacterial infection or co-infection with other pathogens not detected by the test.  Clinical correlation with patient history and  other diagnostic information is necessary to determine patient infection status.  The expected result is negative. Fact Sheet for Patients:   03/16/20  Fact Sheet for Healthcare Providers:   BoilerBrush.com.cy   This test is not yet approved or cleared by the https://pope.com/ FDA and  has been authorized for detection and/or diagnosis of SARS-CoV-2 by FDA under an Emergency Use Authorization (EUA).  This EUA will remain in effect (meaning this test can be u sed) for the duration of  the COVID-19 declaration under Section 564(b)(1) of the Act, 21 U.S.C. section 360-bbb-3(b)(1), unless the authorization is terminated or revoked sooner. Performed at [facility], [address], [place], [place]   Surgical PCR screen     Status: None   Collection Time: 03/16/20 12:55 AM   Specimen:  Nasal Mucosa; Nasal Swab  Result Value Ref Range Status   MRSA, PCR NEGATIVE NEGATIVE  Final   Staphylococcus aureus NEGATIVE NEGATIVE Final    Comment: (NOTE) The Xpert SA Assay (FDA approved for NASAL specimens in patients 43 years of age and older), is one component of a comprehensive surveillance program. It is not intended to diagnose infection nor to guide or monitor treatment. Performed at [facility], [address] [place], [place] [address]      Time coordinating discharge: Over 30 minutes  SIGNED:   Charise Killian, MD  [facility] 03/18/2020, 11:29 AM Pager   If 7PM-7AM, please contact night-coverage [email or web address]

## 2020-03-18 NOTE — Discharge Instructions (Signed)
In addition to included general post-operative instructions for laparoscopic cholecystectomy,  Diet: Resume home diet. Recommend avoiding greasy or fatty foods for the first few days after surgery, you may notice diarrhea if you eat these   Activity: No heavy lifting >20 pounds (children, pets, laundry, garbage) for 4 weeks, but light activity and walking are encouraged. Do not drive or drink alcohol if taking narcotic pain medications or having pain that might distract from driving.  Wound care: IF YOU CAN WATERPROOF THE DRAIN SITE, you may shower/get incision wet with soapy water and pat dry (do not rub incisions), but no baths or submerging incision underwater until follow-up.   Medications: Resume all home medications. For mild to moderate pain: acetaminophen (Tylenol) or ibuprofen/naproxen (if no kidney disease). Combining Tylenol with alcohol can substantially increase your risk of causing liver disease. Narcotic pain medications, if prescribed, can be used for severe pain, though may cause nausea, constipation, and drowsiness. Do not combine Tylenol and Percocet (or similar) within a 6 hour period as Percocet (and similar) contain(s) Tylenol. If you do not need the narcotic pain medication, you do not need to fill the prescription.  Call office [phone] / [phone]) at any time if any questions, worsening pain, fevers/chills, bleeding, drainage from incision site, or other concerns.

## 2020-03-21 LAB — SURGICAL PATHOLOGY

## 2020-03-24 ENCOUNTER — Encounter: Payer: Self-pay | Admitting: General Surgery

## 2020-03-24 ENCOUNTER — Ambulatory Visit (INDEPENDENT_AMBULATORY_CARE_PROVIDER_SITE_OTHER): Payer: Self-pay | Admitting: General Surgery

## 2020-03-24 ENCOUNTER — Other Ambulatory Visit: Payer: Self-pay

## 2020-03-24 VITALS — BP 146/95 | HR 81 | Temp 98.5°F | Resp 15 | Ht 71.0 in | Wt 295.0 lb

## 2020-03-24 DIAGNOSIS — Z9049 Acquired absence of other specified parts of digestive tract: Secondary | ICD-10-CM

## 2020-03-24 NOTE — Patient Instructions (Addendum)
You may return to work starting June 23rd with out restrictions. Finish all your antibiotics You may shower. Keep a bandage over the drain site until it stops draining. Follow-up with our office as needed.  Please call and ask to speak with a nurse if you develop questions or concerns.   GENERAL POST-OPERATIVE PATIENT INSTRUCTIONS   WOUND CARE INSTRUCTIONS:  Keep a dry clean dressing on the wound if there is drainage. The initial bandage may be removed after 24 hours.  Once the wound has quit draining you may leave it open to air.  If clothing rubs against the wound or causes irritation and the wound is not draining you may cover it with a dry dressing during the daytime.  Try to keep the wound dry and avoid ointments on the wound unless directed to do so.  If the wound becomes bright red and painful or starts to drain infected material that is not clear, please contact your physician immediately.  If the wound is mildly pink and has a thick firm ridge underneath it, this is normal, and is referred to as a healing ridge.  This will resolve over the next 4-6 weeks.  BATHING: You may shower if you have been informed of this by your surgeon. However, Please do not submerge in a tub, hot tub, or pool until incisions are completely sealed or have been told by your surgeon that you may do so.  DIET:  You may eat any foods that you can tolerate.  It is a good idea to eat a high fiber diet and take in plenty of fluids to prevent constipation.  If you do become constipated you may want to take a mild laxative or take ducolax tablets on a daily basis until your bowel habits are regular.  Constipation can be very uncomfortable, along with straining, after recent surgery.  ACTIVITY:  You are encouraged to cough and deep breath or use your incentive spirometer if you were given one, every 15-30 minutes when awake.  This will help prevent respiratory complications and low grade fevers post-operatively if you had  a general anesthetic.  You may want to hug a pillow when coughing and sneezing to add additional support to the surgical area, if you had abdominal or chest surgery, which will decrease pain during these times.  You are encouraged to walk and engage in light activity for the next two weeks.  You should not lift more than 20 pounds for 6 weeks after surgery as it could put you at increased risk for complications.  Twenty pounds is roughly equivalent to a plastic bag of groceries. At that time- Listen to your body when lifting, if you have pain when lifting, stop and then try again in a few days. Soreness after doing exercises or activities of daily living is normal as you get back in to your normal routine.  MEDICATIONS:  Try to take narcotic medications and anti-inflammatory medications, such as tylenol, ibuprofen, naprosyn, etc., with food.  This will minimize stomach upset from the medication.  Should you develop nausea and vomiting from the pain medication, or develop a rash, please discontinue the medication and contact your physician.  You should not drive, make important decisions, or operate machinery when taking narcotic pain medication.  SUNBLOCK Use sun block to incision area over the next year if this area will be exposed to sun. This helps decrease scarring and will allow you avoid a permanent darkened area over your incision.  QUESTIONS:  Please feel free to call our office if you have any questions, and we will be glad to assist you.

## 2020-03-24 NOTE — Progress Notes (Signed)
Albert Rice is here today for a postoperative visit.  He is a 44 year old man who came in with acute cholecystitis.  He underwent an uncomplicated laparoscopic cholecystectomy on March 16, 2020.  Due to frank pus in his gallbladder, a drain was left.  He is here today for possible drain removal as well as a routine postoperative visit.  He continues to endorse some soreness at his incision sites.  He says that he he had some nausea upon awakening this morning, but today is the first day that that has occurred.  He does endorse pudding-like stools about 3 times daily.  He is still taking the antibiotic that he was discharged with.  He says that the drain has been putting about 70 cc/day. He has not had any fevers or chills.  Today's Vitals   03/24/20 1010  BP: (!) 146/95  Pulse: 81  Resp: 15  Temp: 98.5 F (36.9 C)  SpO2: 100%  Weight: 295 lb (133.8 kg)  Height: 5\' 11"  (1.803 m)   Body mass index is 41.14 kg/m. Focused examination demonstrates that his surgical incisions are healing nicely.  There is no erythema, induration, or drainage.  The drain has a small amount of serous fluid.  I removed his drain today.  He was counseled that the diarrhea should resolve with time.  This should also improve after he completes his course of antibiotics.  If it persists, we may consider adding cholestyramine.  He should continue to avoid lifting anything heavier than 10 pounds for another 2 weeks.  I will plan to see him on as-needed basis.

## 2020-04-04 ENCOUNTER — Telehealth: Payer: Self-pay | Admitting: *Deleted

## 2020-04-04 NOTE — Telephone Encounter (Signed)
Faxed FMLA to [employer] at [phone] 

## 2020-04-06 ENCOUNTER — Other Ambulatory Visit: Payer: Self-pay

## 2021-01-29 NOTE — Progress Notes (Signed)
01/30/2021  2:08 PM   Albert Rice [birth year] [id number]  Referring provider: Addison Naegeli, MD [address] [place],  [place] [address] Chief Complaint  Patient presents with  . Erectile Dysfunction    HPI: Albert Rice is a 45 y.o. male referred for evaluation of erectile dysfunction.   Duration 5 years  Erections have been firm enough for penetration 100% of the time however he has difficulty maintaining the erection and will lose prior to ejaculation  Decreased rigidity of his erections  No pain or curvature  Started daily tadalafil ~ 3 months ago without significant improvement  No tiredness, fatigue or decreased libido  Organic risk factors include diabetes, hyperlipidemia, HTN and anti-HTN medications  Patient denies any current or prior tobacco use  On finasteride/tamsulosin for BPH   PMH: Past Medical History:  Diagnosis Date  . Diabetes mellitus without complication (HCC)   . Hyperlipemia   . Hypertension     Surgical History: Past Surgical History:  Procedure Laterality Date  . CHOLECYSTECTOMY N/A 03/16/2020   Procedure: LAPAROSCOPIC CHOLECYSTECTOMY;  Surgeon: Duanne Guess, MD;  Location: [facility] ORS;  Service: General;  Laterality: N/A;  . pelvis     Surgery for fractured pelvis    Home Medications:  Allergies as of 01/30/2021   No Known Allergies     Medication List       Accurate as of January 30, 2021  2:08 PM. If you have any questions, ask your nurse or doctor.        amLODipine 10 MG tablet Commonly known as: NORVASC Take 10 mg by mouth daily.   aspirin 81 MG EC tablet Take 81 mg by mouth daily at 12 noon.   atorvastatin 40 MG tablet Commonly known as: LIPITOR Take by mouth.   chlorthalidone 25 MG tablet Commonly known as: HYGROTON Take 25 mg by mouth daily.   cyclobenzaprine 7.5 MG tablet Commonly known as: FEXMID Take 1 tablet (7.5 mg total) by mouth at bedtime as needed for muscle spasms.   ferrous  gluconate 324 MG tablet Commonly known as: FERGON Take 1 tablet (324 mg total) by mouth 2 (two) times daily with a meal.   finasteride 5 MG tablet Commonly known as: PROSCAR Take by mouth.   metFORMIN 1000 MG tablet Commonly known as: GLUCOPHAGE Take 1,000 mg by mouth 2 (two) times daily with a meal.   pravastatin 20 MG tablet Commonly known as: PRAVACHOL Take 20 mg by mouth daily.   tadalafil 5 MG tablet Commonly known as: CIALIS Take 5 mg by mouth daily.   tamsulosin 0.4 MG Caps capsule Commonly known as: FLOMAX Take 0.4 mg by mouth daily.       Allergies: No Known Allergies  Family History: Family History  Problem Relation Age of Onset  . Bone cancer Father 39    Social History:   reports that he has never smoked. He has never used smokeless tobacco. He reports that he does not drink alcohol and does not use drugs.  ROS: Pertinent ROS in HPI.  Physical Exam: BP (!) 178/109   Pulse 97   Ht 5\' 11"  (1.803 m)   Wt (!) 308 lb (139.7 kg)   BMI 42.96 kg/m   Constitutional:  Alert and oriented, No acute distress. HEENT: [place] AT, moist mucus membranes.  Trachea midline, no masses. Cardiovascular: No clubbing, cyanosis, or edema. Respiratory: Normal respiratory effort, no increased work of breathing. GU: Phallus normal, no penile plaque, testes slightly atrophic  bilaterally 15 cc. No CVA tenderness.  No bladder fullness or masses.  Patient with uncircumcised phallus.  Foreskin easily retracted.   Urethral meatus is patent.  No penile discharge. No penile lesions or rashes. Scrotum without lesions, cysts, rashes and/or edema.  Testicles are located scrotally bilaterally. No masses are appreciated in the testicles. Left and right epididymis are normal. Skin: No rashes, bruises or suspicious lesions. Neurologic: Grossly intact, no focal deficits, moving all 4 extremities. Psychiatric: Normal mood and affect.    Assessment & Plan:   1. Erectile dysfunction  -Trial  tadalafil 20 mg 1 hour prior to intercourse  -Discussed the possibility of venoocclusive disease and if tadalafil 20 mg not effective would recommend a venous compression band (Veno[employer]al [employer])  - In terms of PDE 5 inhibitors, we discussed contraindications for this medication as well as common side effects. Patient was counseled on optimal use. All of his questions were answered in detail.   Follow Up:  RTC in 1 month to review symptoms   I, Wyn Quaker, am acting as a scribe for Dr. Irineo Axon.  I have reviewed the above documentation for accuracy and completeness, and I agree with the above.    Albert Altes, MD  [facility] [address] [place], [place] [address] [phone]

## 2021-01-30 ENCOUNTER — Encounter: Payer: Self-pay | Admitting: Urology

## 2021-01-30 ENCOUNTER — Ambulatory Visit (INDEPENDENT_AMBULATORY_CARE_PROVIDER_SITE_OTHER): Payer: BC Managed Care – PPO | Admitting: Urology

## 2021-01-30 ENCOUNTER — Other Ambulatory Visit: Payer: Self-pay

## 2021-01-30 VITALS — BP 178/109 | HR 97 | Ht 71.0 in | Wt 308.0 lb

## 2021-01-30 DIAGNOSIS — N5203 Combined arterial insufficiency and corporo-venous occlusive erectile dysfunction: Secondary | ICD-10-CM | POA: Diagnosis not present

## 2021-01-30 MED ORDER — TADALAFIL 20 MG PO TABS: ORAL_TABLET | ORAL | 0 refills | Status: DC

## 2021-01-30 NOTE — Patient Instructions (Signed)
Albert Rice medical

## 2021-02-08 ENCOUNTER — Ambulatory Visit: Payer: BC Managed Care – PPO | Admitting: Urology

## 2021-03-01 ENCOUNTER — Other Ambulatory Visit: Payer: Self-pay | Admitting: Urology

## 2021-03-02 ENCOUNTER — Telehealth: Payer: Self-pay

## 2021-03-02 NOTE — Telephone Encounter (Signed)
Pt left message asking for refill on Cialis

## 2021-03-03 ENCOUNTER — Telehealth: Payer: Self-pay | Admitting: Urology

## 2021-03-03 MED ORDER — TADALAFIL 20 MG PO TABS: ORAL_TABLET | ORAL | 3 refills | Status: DC

## 2021-03-03 NOTE — Telephone Encounter (Signed)
error 

## 2021-03-03 NOTE — Telephone Encounter (Signed)
Rx sent 

## 2021-03-03 NOTE — Addendum Note (Signed)
Addended by: Riki Altes on: 03/03/2021 03:48 PM   Modules accepted: Orders

## 2021-07-14 ENCOUNTER — Encounter: Payer: Self-pay | Admitting: General Surgery

## 2022-02-20 IMAGING — CT CT ABD-PELV W/ CM
2 of 5 series · 17 of 46 positions shown, 19 images · IV contrast (APPLIED)
Comparison: 06/15/2019

CLINICAL DATA: Acute abdominal pain.  Non localized.

EXAM:
CT ABDOMEN AND PELVIS WITH CONTRAST
TECHNIQUE: Multidetector CT imaging of the abdomen and pelvis was performed
using the standard protocol following bolus administration of
intravenous contrast.
CONTRAST:  125mL OMNIPAQUE IOHEXOL 300 MG/ML  SOLN

[Series 2: routine abd/pel with · axial · 0.98mm/px · z∈[-497,-72]mm · 14 of 97 slices shown, 16 images]
[im 6/97  soft-tissue]
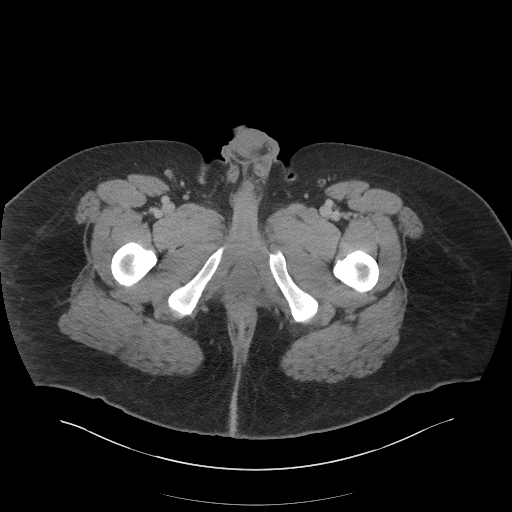
[im 6/97  bone]
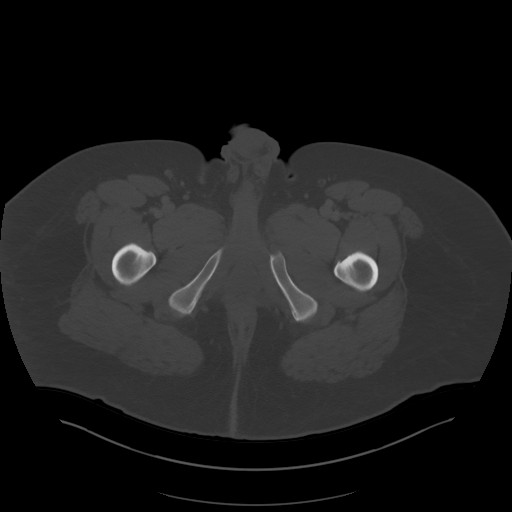
[im 11/97  soft-tissue]
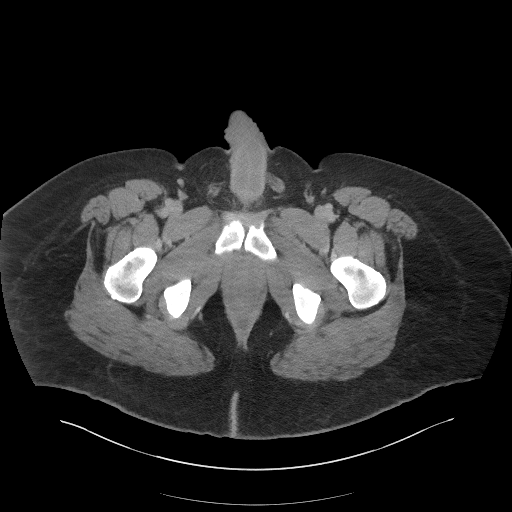
[im 22/97  soft-tissue]
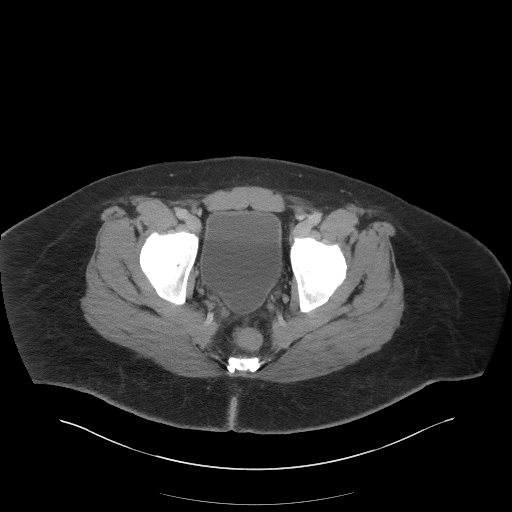
[im 27/97  soft-tissue]
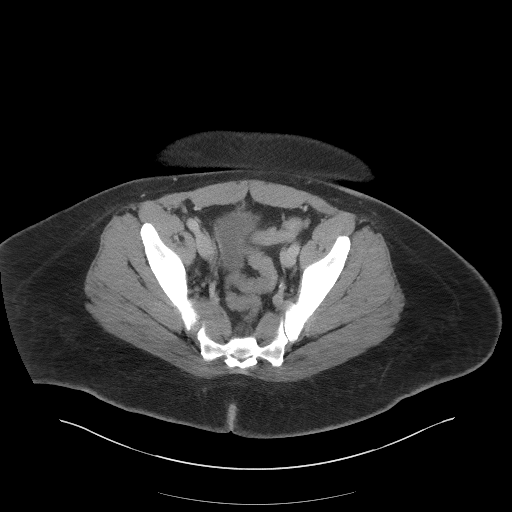
[im 33/97  soft-tissue]
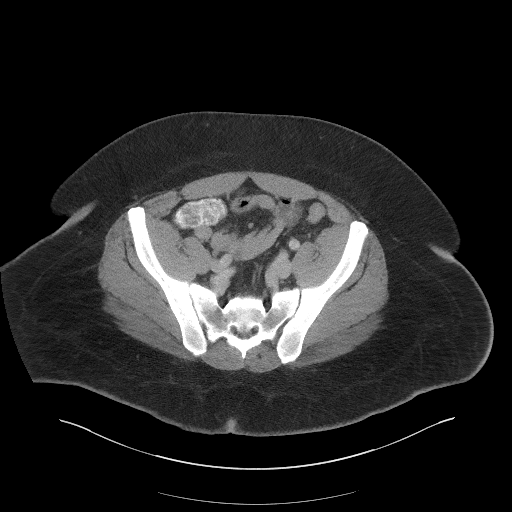
[im 38/97  soft-tissue]
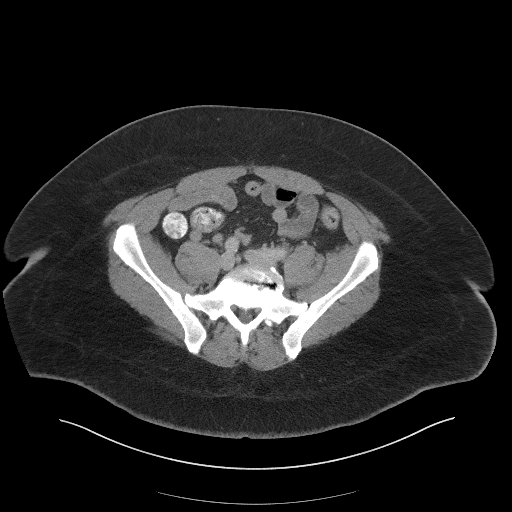
[im 43/97  soft-tissue]
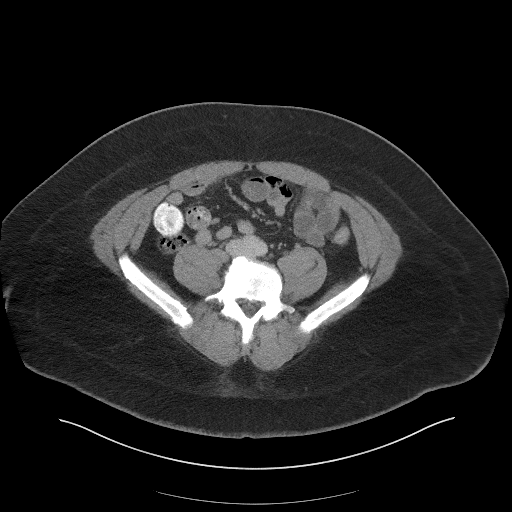
[im 54/97  soft-tissue]
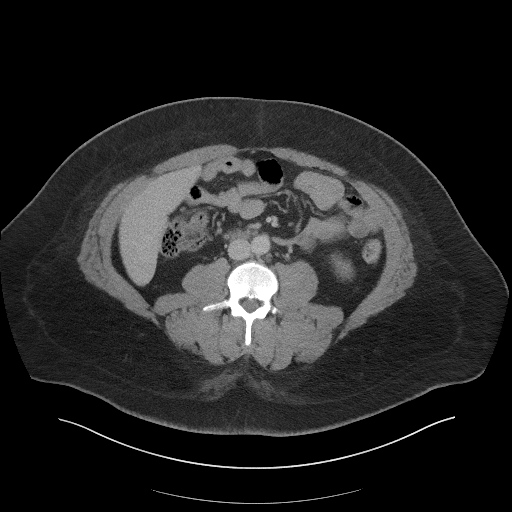
[im 59/97  soft-tissue]
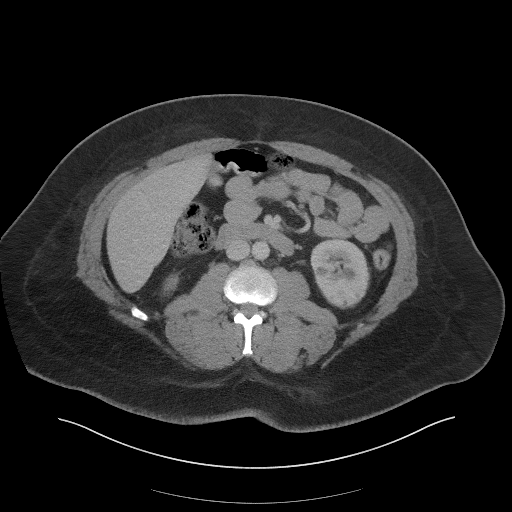
[im 59/97  bone]
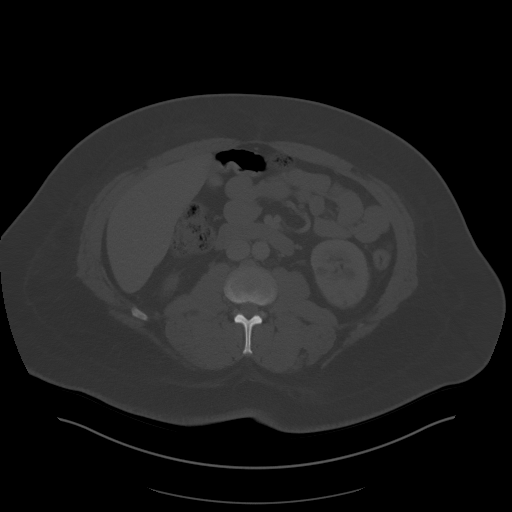
[im 65/97  soft-tissue]
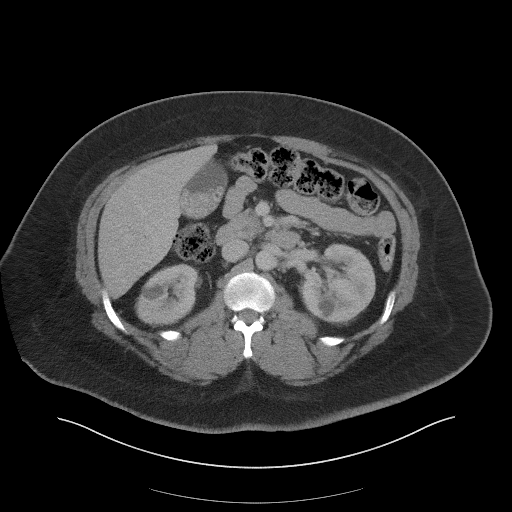
[im 70/97  soft-tissue]
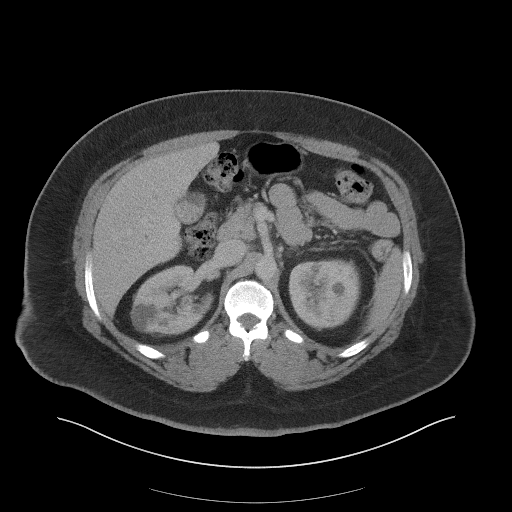
[im 75/97  soft-tissue]
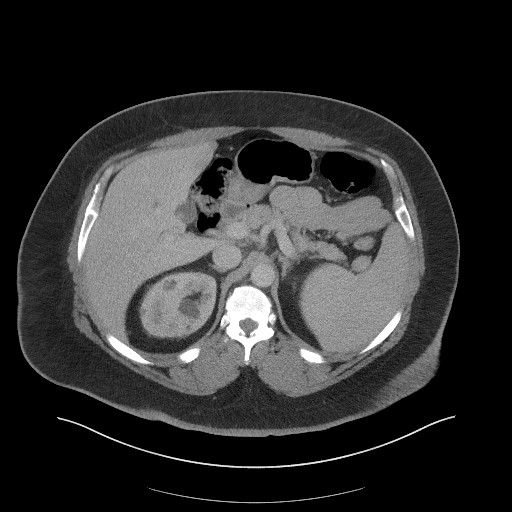
[im 86/97  soft-tissue]
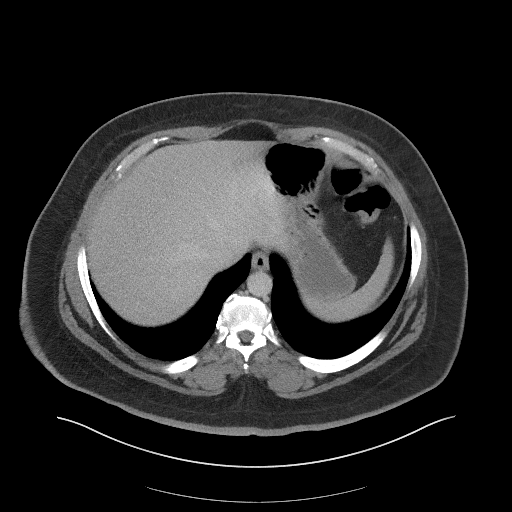
[im 91/97  soft-tissue]
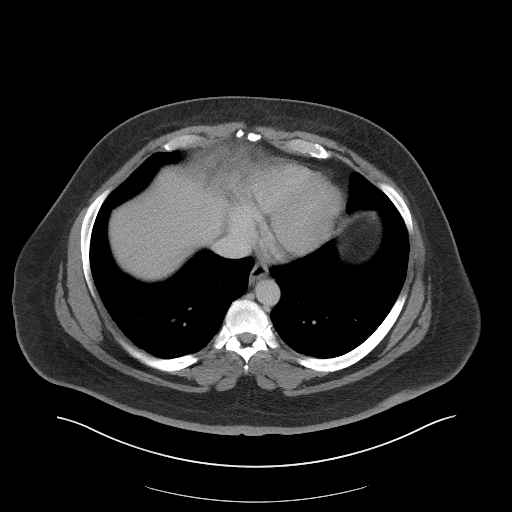

[Series 5: coronal st · coronal · 0.97mm/px · 3 of 106 slices shown]
[im 36/106  soft-tissue]
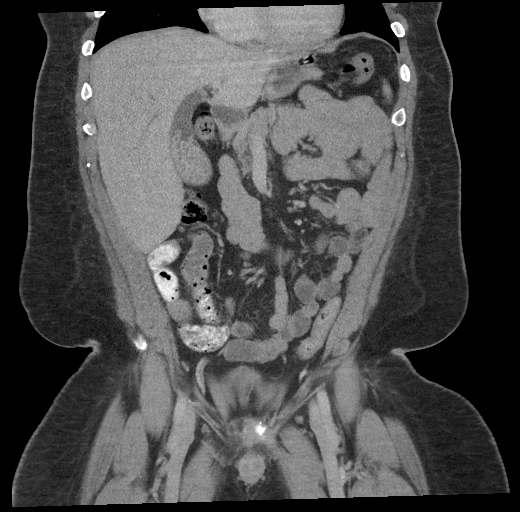
[im 47/106  soft-tissue]
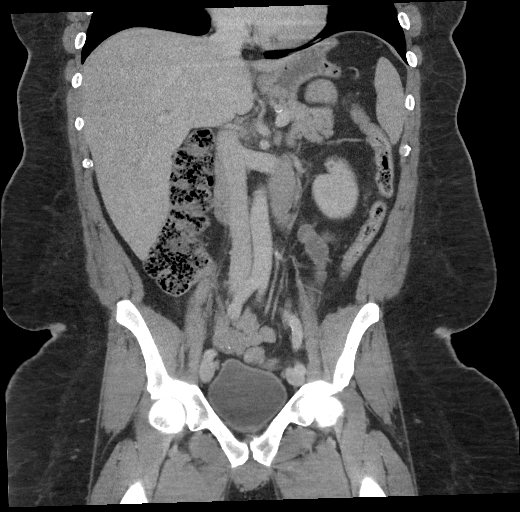
[im 59/106  soft-tissue]
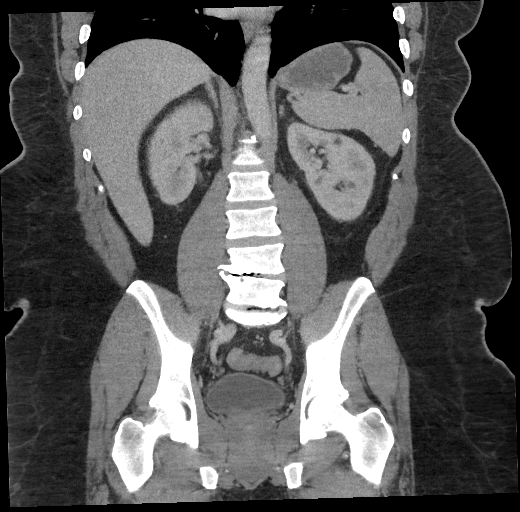

[17 of 46 positions shown; findings below may reference images not displayed]

FINDINGS: Lower chest: No acute abnormality.

Hepatobiliary: Normal liver. Cholelithiasis. No intrahepatic or
extrahepatic biliary ductal dilatation.

Pancreas: Unremarkable. No pancreatic ductal dilatation or
surrounding inflammatory changes.

Spleen: Normal in size without focal abnormality.

Adrenals/Urinary Tract: Normal adrenal glands. Hypodense masses
throughout bilateral kidneys with the larger masses measuring fluid
attenuation. The largest mass in the upper pole of the right kidney
measures 2.9 cm. No obstructive uropathy.

Stomach/Bowel: Stomach is within normal limits. Appendix appears
normal. No evidence of bowel wall thickening, distention, or
inflammatory changes.

Vascular/Lymphatic: No significant vascular findings are present. No
enlarged abdominal or pelvic lymph nodes.

Reproductive: Prostate is unremarkable.

Other: No abdominal wall hernia or abnormality. No abdominopelvic
ascites.

Musculoskeletal: No acute osseous abnormality. No aggressive osseous
lesion. Degenerative disease with disc height loss at L4-5 and
L5-S1.
IMPRESSION: 1. No acute abdominal or pelvic pathology.
2. Cholelithiasis.
3. Normal appendix.
4. Bilateral renal cysts.

## 2022-02-21 IMAGING — NM NM HEPATOBILIARY IMAGE, INC GB
2 series · 10 of 10 positions shown · non-contrast
Comparison: Ultrasound 03/14/2020.  CT 03/14/2020.

CLINICAL DATA: Cholelithiasis.

EXAM:
NUCLEAR MEDICINE HEPATOBILIARY IMAGING
TECHNIQUE: Sequential images of the abdomen were obtained [DATE] minutes
following intravenous administration of radiopharmaceutical.
RADIOPHARMACEUTICALS:  5.2 mCi Zc-CCm  Choletec IV

[Series 1000: hepato 3 hr delay · delayed · 2.40mm/px · 2 acquisitions, 4 frames shown]
[im 1/2]
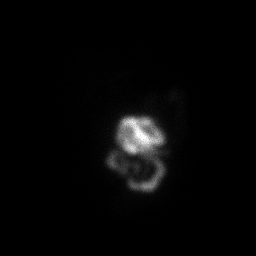
[im 1/2]
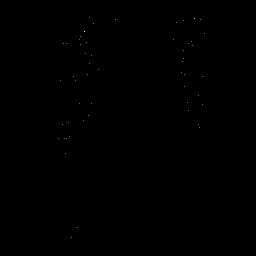
[im 2/2]
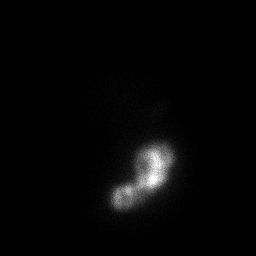
[im 2/2]
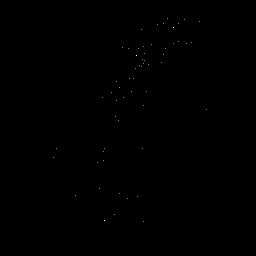

[Series 1000: hepatobiliary scan · 9.59mm/px · 6 of 60 frames shown]
[frame 6/60]
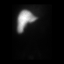
[frame 16/60]
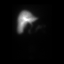
[frame 26/60]
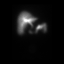
[frame 36/60]
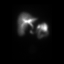
[frame 46/60]
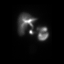
[frame 56/60]
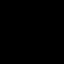

[10 of 10 positions shown; findings below may reference images not displayed]

FINDINGS: Liver, biliary system, and bowel visualize normally. Gallbladder
does not visualize [DATE] hours. Findings suggest the possibility
cholecystitis.
IMPRESSION: Nonvisualization of the gallbladder [DATE] hours. Findings suggest
possibility of cholecystitis.

## 2022-03-08 ENCOUNTER — Other Ambulatory Visit: Payer: Self-pay | Admitting: Urology

## 2022-04-12 ENCOUNTER — Telehealth: Payer: Self-pay | Admitting: Urology

## 2022-04-18 ENCOUNTER — Other Ambulatory Visit: Payer: Self-pay | Admitting: *Deleted

## 2022-04-18 MED ORDER — TADALAFIL 20 MG PO TABS: ORAL_TABLET | ORAL | 0 refills | Status: DC

## 2022-04-18 NOTE — Telephone Encounter (Signed)
Sent in 10 pills of cialis to [facility] until his next appt. Patient advised

## 2022-04-18 NOTE — Telephone Encounter (Signed)
Pt has an appt w/[facility] next week.  He wants to know if he can get 1 week of Cialis sent to [facility] on [address].  Please call pt and let him know if this can be done today.  His phone is being repaired, so please call his wife's number (he will answer) [phone].

## 2022-04-25 ENCOUNTER — Ambulatory Visit: Payer: BC Managed Care – PPO | Admitting: Urology

## 2022-04-25 ENCOUNTER — Encounter: Payer: Self-pay | Admitting: Urology

## 2022-04-25 VITALS — BP 155/93 | HR 84 | Ht 71.0 in | Wt 300.0 lb

## 2022-04-25 DIAGNOSIS — N5203 Combined arterial insufficiency and corporo-venous occlusive erectile dysfunction: Secondary | ICD-10-CM | POA: Diagnosis not present

## 2022-04-25 DIAGNOSIS — E291 Testicular hypofunction: Secondary | ICD-10-CM | POA: Diagnosis not present

## 2022-04-25 MED ORDER — TADALAFIL 20 MG PO TABS: ORAL_TABLET | ORAL | 3 refills | Status: DC

## 2022-04-25 NOTE — Patient Instructions (Signed)
Venoseal can be purchased from [facility] or [employer]

## 2022-04-25 NOTE — Progress Notes (Signed)
04/25/2022  11:20 AM   Albert Rice [birth year] [id number]  Referring provider: Addison Naegeli, MD [address] [place],  [place] [address] Chief Complaint  Patient presents with   Other    HPI: 46 y.o. male referred for follow-up for medication refill  Seen April 2022 for ED Started on tadalafil which he states has been effective History of low testosterone and he was inquiring if testosterone replacement would be beneficial enough that he would not have to take oral medication for ED   PMH: Past Medical History:  Diagnosis Date   Diabetes mellitus without complication (HCC)    Hyperlipemia    Hypertension     Surgical History: Past Surgical History:  Procedure Laterality Date   CHOLECYSTECTOMY N/A 03/16/2020   Procedure: LAPAROSCOPIC CHOLECYSTECTOMY;  Surgeon: Duanne Guess, MD;  Location: [facility] ORS;  Service: General;  Laterality: N/A;   pelvis     Surgery for fractured pelvis    Home Medications:  Allergies as of 04/25/2022   No Known Allergies      Medication List        Accurate as of April 25, 2022 11:20 AM. If you have any questions, ask your nurse or doctor.          amLODipine 10 MG tablet Commonly known as: NORVASC Take 10 mg by mouth daily.   aspirin EC 81 MG tablet Take 81 mg by mouth daily at 12 noon.   atorvastatin 40 MG tablet Commonly known as: LIPITOR Take by mouth.   chlorthalidone 25 MG tablet Commonly known as: HYGROTON Take 25 mg by mouth daily.   cyclobenzaprine 7.5 MG tablet Commonly known as: FEXMID Take 1 tablet (7.5 mg total) by mouth at bedtime as needed for muscle spasms.   ferrous gluconate 324 MG tablet Commonly known as: FERGON Take 1 tablet (324 mg total) by mouth 2 (two) times daily with a meal.   metFORMIN 1000 MG tablet Commonly known as: GLUCOPHAGE Take 1,000 mg by mouth 2 (two) times daily with a meal.   pravastatin 20 MG tablet Commonly known as: PRAVACHOL Take 20 mg by mouth daily.    tadalafil 20 MG tablet Commonly known as: CIALIS TAKE ONE TABLEY BY MOUTH ONE HOUR PRIOR TO INTERCOURSE   tamsulosin 0.4 MG Caps capsule Commonly known as: FLOMAX Take 0.4 mg by mouth daily.        Allergies: No Known Allergies  Family History: Family History  Problem Relation Age of Onset   Bone cancer Father 103    Social History:   reports that he has never smoked. He has never used smokeless tobacco. He reports that he does not drink alcohol and does not use drugs.  ROS: Pertinent ROS in HPI.  Physical Exam: BP (!) 155/93   Pulse 84   Ht 5\' 11"  (1.803 m)   Wt 300 lb (136.1 kg)   BMI 41.84 kg/m   Constitutional:  Alert and oriented, No acute distress. HEENT: [place] AT Respiratory: Normal respiratory effort, no increased work of breathing. Psychiatric: Normal mood and affect.    Assessment & Plan:    1. Erectile dysfunction  Tadalafil refilled If his PCP will resume refill RXs can see back as needed  2.  Hypogonadism Last testosterone level 12/2021 was low normal at 276 Could consider a trial of Clomid however he was informed improving his T level would most likely not obviate the need for a PDE 5 inhibitor Repeat testosterone level and LH drawn today  1 year follow-up made if needed for tadalafil refill   Riki Altes, MD  [facility] [address] [place], [place] [address] [phone]

## 2022-04-26 LAB — LUTEINIZING HORMONE: LH: 9.7 m[IU]/mL — ABNORMAL HIGH (ref 1.7–8.6)

## 2022-04-26 LAB — TESTOSTERONE: Testosterone: 393 ng/dL (ref 264–916)

## 2022-04-27 ENCOUNTER — Telehealth: Payer: Self-pay | Admitting: Family Medicine

## 2022-04-27 NOTE — Telephone Encounter (Signed)
Unable to leave message, mailbox is full.

## 2022-04-27 NOTE — Telephone Encounter (Signed)
-----   Message from Riki Altes, MD sent at 04/27/2022  7:35 AM EDT ----- Testosterone level was low normal at 393.  His LH was slightly elevated so I do not think oral Clomid would be beneficial.  Insurance will not cover testosterone replacement since his level is normal.  Recommend to continue to follow the testosterone level.

## 2022-04-27 NOTE — Telephone Encounter (Signed)
Patient notified and voiced understanding.

## 2023-02-08 ENCOUNTER — Other Ambulatory Visit: Payer: Self-pay | Admitting: Urology

## 2023-04-26 ENCOUNTER — Ambulatory Visit: Payer: BC Managed Care – PPO | Admitting: Urology

## 2023-04-26 ENCOUNTER — Encounter: Payer: Self-pay | Admitting: Urology
